# Patient Record
Sex: Female | Born: 1940 | Race: Black or African American | Hispanic: No | Smoking: Never smoker
Health system: Southern US, Community
[De-identification: ages and names within clinical notes are randomized; demographics above are authoritative.]

## PROBLEM LIST (undated history)

## (undated) DIAGNOSIS — H409 Unspecified glaucoma: Secondary | ICD-10-CM

## (undated) HISTORY — PX: CHOLECYSTECTOMY: SHX55

## (undated) HISTORY — PX: ABDOMINAL HYSTERECTOMY: SHX81

---

## 2020-06-10 DIAGNOSIS — I10 Essential (primary) hypertension: Secondary | ICD-10-CM | POA: Diagnosis not present

## 2020-06-10 DIAGNOSIS — I82503 Chronic embolism and thrombosis of unspecified deep veins of lower extremity, bilateral: Secondary | ICD-10-CM | POA: Diagnosis not present

## 2020-06-10 DIAGNOSIS — I2601 Septic pulmonary embolism with acute cor pulmonale: Secondary | ICD-10-CM | POA: Diagnosis not present

## 2020-06-10 DIAGNOSIS — Z23 Encounter for immunization: Secondary | ICD-10-CM | POA: Diagnosis not present

## 2020-07-02 DIAGNOSIS — I10 Essential (primary) hypertension: Secondary | ICD-10-CM | POA: Diagnosis not present

## 2020-07-02 DIAGNOSIS — I2601 Septic pulmonary embolism with acute cor pulmonale: Secondary | ICD-10-CM | POA: Diagnosis not present

## 2020-07-02 DIAGNOSIS — E782 Mixed hyperlipidemia: Secondary | ICD-10-CM | POA: Diagnosis not present

## 2020-07-02 DIAGNOSIS — I82503 Chronic embolism and thrombosis of unspecified deep veins of lower extremity, bilateral: Secondary | ICD-10-CM | POA: Diagnosis not present

## 2020-07-08 DIAGNOSIS — H25813 Combined forms of age-related cataract, bilateral: Secondary | ICD-10-CM | POA: Diagnosis not present

## 2020-07-08 DIAGNOSIS — H04123 Dry eye syndrome of bilateral lacrimal glands: Secondary | ICD-10-CM | POA: Diagnosis not present

## 2020-07-08 DIAGNOSIS — H401131 Primary open-angle glaucoma, bilateral, mild stage: Secondary | ICD-10-CM | POA: Diagnosis not present

## 2020-07-30 DIAGNOSIS — M545 Low back pain, unspecified: Secondary | ICD-10-CM | POA: Diagnosis not present

## 2020-07-30 DIAGNOSIS — I10 Essential (primary) hypertension: Secondary | ICD-10-CM | POA: Diagnosis not present

## 2020-07-30 DIAGNOSIS — I82503 Chronic embolism and thrombosis of unspecified deep veins of lower extremity, bilateral: Secondary | ICD-10-CM | POA: Diagnosis not present

## 2020-07-30 DIAGNOSIS — E782 Mixed hyperlipidemia: Secondary | ICD-10-CM | POA: Diagnosis not present

## 2020-07-30 DIAGNOSIS — M130 Polyarthritis, unspecified: Secondary | ICD-10-CM | POA: Diagnosis not present

## 2020-08-04 DIAGNOSIS — M545 Low back pain, unspecified: Secondary | ICD-10-CM | POA: Diagnosis not present

## 2020-08-18 DIAGNOSIS — M545 Low back pain, unspecified: Secondary | ICD-10-CM | POA: Diagnosis not present

## 2020-08-28 DIAGNOSIS — M5126 Other intervertebral disc displacement, lumbar region: Secondary | ICD-10-CM | POA: Diagnosis not present

## 2020-10-07 DIAGNOSIS — H04123 Dry eye syndrome of bilateral lacrimal glands: Secondary | ICD-10-CM | POA: Diagnosis not present

## 2020-10-07 DIAGNOSIS — H25813 Combined forms of age-related cataract, bilateral: Secondary | ICD-10-CM | POA: Diagnosis not present

## 2020-10-07 DIAGNOSIS — H401131 Primary open-angle glaucoma, bilateral, mild stage: Secondary | ICD-10-CM | POA: Diagnosis not present

## 2020-10-30 DIAGNOSIS — E039 Hypothyroidism, unspecified: Secondary | ICD-10-CM | POA: Diagnosis not present

## 2020-10-30 DIAGNOSIS — E782 Mixed hyperlipidemia: Secondary | ICD-10-CM | POA: Diagnosis not present

## 2020-10-30 DIAGNOSIS — I82503 Chronic embolism and thrombosis of unspecified deep veins of lower extremity, bilateral: Secondary | ICD-10-CM | POA: Diagnosis not present

## 2020-10-30 DIAGNOSIS — I10 Essential (primary) hypertension: Secondary | ICD-10-CM | POA: Diagnosis not present

## 2020-10-30 DIAGNOSIS — Z0001 Encounter for general adult medical examination with abnormal findings: Secondary | ICD-10-CM | POA: Diagnosis not present

## 2020-10-30 DIAGNOSIS — E1169 Type 2 diabetes mellitus with other specified complication: Secondary | ICD-10-CM | POA: Diagnosis not present

## 2020-11-04 ENCOUNTER — Emergency Department (HOSPITAL_COMMUNITY): Payer: Medicare Other

## 2020-11-04 ENCOUNTER — Inpatient Hospital Stay (HOSPITAL_COMMUNITY): Payer: Medicare Other

## 2020-11-04 ENCOUNTER — Inpatient Hospital Stay (HOSPITAL_COMMUNITY)
Admission: EM | Admit: 2020-11-04 | Discharge: 2020-11-11 | DRG: 871 | Disposition: A | Discharge status: Home Health | Payer: Medicare Other | Attending: Internal Medicine | Admitting: Internal Medicine

## 2020-11-04 ENCOUNTER — Encounter (HOSPITAL_COMMUNITY): Payer: Self-pay | Admitting: Family Medicine

## 2020-11-04 ENCOUNTER — Other Ambulatory Visit: Payer: Self-pay

## 2020-11-04 DIAGNOSIS — D1809 Hemangioma of other sites: Secondary | ICD-10-CM | POA: Diagnosis present

## 2020-11-04 DIAGNOSIS — R17 Unspecified jaundice: Secondary | ICD-10-CM | POA: Diagnosis not present

## 2020-11-04 DIAGNOSIS — H409 Unspecified glaucoma: Secondary | ICD-10-CM | POA: Diagnosis present

## 2020-11-04 DIAGNOSIS — R6889 Other general symptoms and signs: Secondary | ICD-10-CM | POA: Diagnosis not present

## 2020-11-04 DIAGNOSIS — Z7901 Long term (current) use of anticoagulants: Secondary | ICD-10-CM | POA: Diagnosis not present

## 2020-11-04 DIAGNOSIS — R001 Bradycardia, unspecified: Secondary | ICD-10-CM | POA: Diagnosis not present

## 2020-11-04 DIAGNOSIS — K5730 Diverticulosis of large intestine without perforation or abscess without bleeding: Secondary | ICD-10-CM | POA: Diagnosis not present

## 2020-11-04 DIAGNOSIS — Z743 Need for continuous supervision: Secondary | ICD-10-CM | POA: Diagnosis not present

## 2020-11-04 DIAGNOSIS — K8030 Calculus of bile duct with cholangitis, unspecified, without obstruction: Secondary | ICD-10-CM | POA: Diagnosis present

## 2020-11-04 DIAGNOSIS — R112 Nausea with vomiting, unspecified: Secondary | ICD-10-CM | POA: Diagnosis not present

## 2020-11-04 DIAGNOSIS — A419 Sepsis, unspecified organism: Principal | ICD-10-CM

## 2020-11-04 DIAGNOSIS — Z20822 Contact with and (suspected) exposure to covid-19: Secondary | ICD-10-CM | POA: Diagnosis present

## 2020-11-04 DIAGNOSIS — K8309 Other cholangitis: Secondary | ICD-10-CM | POA: Diagnosis not present

## 2020-11-04 DIAGNOSIS — D696 Thrombocytopenia, unspecified: Secondary | ICD-10-CM | POA: Diagnosis not present

## 2020-11-04 DIAGNOSIS — Z86711 Personal history of pulmonary embolism: Secondary | ICD-10-CM | POA: Diagnosis not present

## 2020-11-04 DIAGNOSIS — Z79899 Other long term (current) drug therapy: Secondary | ICD-10-CM

## 2020-11-04 DIAGNOSIS — R7989 Other specified abnormal findings of blood chemistry: Secondary | ICD-10-CM

## 2020-11-04 DIAGNOSIS — Z48815 Encounter for surgical aftercare following surgery on the digestive system: Secondary | ICD-10-CM | POA: Diagnosis not present

## 2020-11-04 DIAGNOSIS — R0902 Hypoxemia: Secondary | ICD-10-CM | POA: Diagnosis not present

## 2020-11-04 DIAGNOSIS — R933 Abnormal findings on diagnostic imaging of other parts of digestive tract: Secondary | ICD-10-CM

## 2020-11-04 DIAGNOSIS — R6521 Severe sepsis with septic shock: Secondary | ICD-10-CM | POA: Diagnosis present

## 2020-11-04 DIAGNOSIS — I10 Essential (primary) hypertension: Secondary | ICD-10-CM

## 2020-11-04 DIAGNOSIS — R748 Abnormal levels of other serum enzymes: Secondary | ICD-10-CM | POA: Diagnosis not present

## 2020-11-04 DIAGNOSIS — R109 Unspecified abdominal pain: Secondary | ICD-10-CM | POA: Diagnosis not present

## 2020-11-04 DIAGNOSIS — K8050 Calculus of bile duct without cholangitis or cholecystitis without obstruction: Secondary | ICD-10-CM | POA: Diagnosis not present

## 2020-11-04 DIAGNOSIS — R7401 Elevation of levels of liver transaminase levels: Secondary | ICD-10-CM | POA: Diagnosis not present

## 2020-11-04 DIAGNOSIS — R52 Pain, unspecified: Secondary | ICD-10-CM

## 2020-11-04 DIAGNOSIS — E876 Hypokalemia: Secondary | ICD-10-CM | POA: Diagnosis not present

## 2020-11-04 DIAGNOSIS — K5790 Diverticulosis of intestine, part unspecified, without perforation or abscess without bleeding: Secondary | ICD-10-CM | POA: Diagnosis not present

## 2020-11-04 DIAGNOSIS — D6959 Other secondary thrombocytopenia: Secondary | ICD-10-CM | POA: Diagnosis not present

## 2020-11-04 DIAGNOSIS — R932 Abnormal findings on diagnostic imaging of liver and biliary tract: Secondary | ICD-10-CM | POA: Diagnosis not present

## 2020-11-04 HISTORY — DX: Unspecified glaucoma: H40.9

## 2020-11-04 LAB — POC OCCULT BLOOD, ED: Fecal Occult Bld: NEGATIVE

## 2020-11-04 LAB — CBC WITH DIFFERENTIAL/PLATELET
Abs Immature Granulocytes: 0.01 10*3/uL (ref 0.00–0.07)
Basophils Absolute: 0 10*3/uL (ref 0.0–0.1)
Basophils Relative: 0 %
Eosinophils Absolute: 0 10*3/uL (ref 0.0–0.5)
Eosinophils Relative: 0 %
HCT: 40.4 % (ref 36.0–46.0)
Hemoglobin: 13.8 g/dL (ref 12.0–15.0)
Immature Granulocytes: 0 %
Lymphocytes Relative: 3 %
Lymphs Abs: 0.2 10*3/uL — ABNORMAL LOW (ref 0.7–4.0)
MCH: 31.7 pg (ref 26.0–34.0)
MCHC: 34.2 g/dL (ref 30.0–36.0)
MCV: 92.7 fL (ref 80.0–100.0)
Monocytes Absolute: 0.1 10*3/uL (ref 0.1–1.0)
Monocytes Relative: 1 %
Neutro Abs: 4.9 10*3/uL (ref 1.7–7.7)
Neutrophils Relative %: 96 %
Platelets: 126 10*3/uL — ABNORMAL LOW (ref 150–400)
RBC: 4.36 MIL/uL (ref 3.87–5.11)
RDW: 14.1 % (ref 11.5–15.5)
WBC: 5.1 10*3/uL (ref 4.0–10.5)
nRBC: 0 % (ref 0.0–0.2)

## 2020-11-04 LAB — APTT: aPTT: 34 seconds (ref 24–36)

## 2020-11-04 LAB — COMPREHENSIVE METABOLIC PANEL
ALT: 224 U/L — ABNORMAL HIGH (ref 0–44)
AST: 300 U/L — ABNORMAL HIGH (ref 15–41)
Albumin: 3.2 g/dL — ABNORMAL LOW (ref 3.5–5.0)
Alkaline Phosphatase: 128 U/L — ABNORMAL HIGH (ref 38–126)
Anion gap: 13 (ref 5–15)
BUN: 20 mg/dL (ref 8–23)
CO2: 18 mmol/L — ABNORMAL LOW (ref 22–32)
Calcium: 9.2 mg/dL (ref 8.9–10.3)
Chloride: 108 mmol/L (ref 98–111)
Creatinine, Ser: 0.83 mg/dL (ref 0.44–1.00)
GFR, Estimated: >60 mL/min (ref >60)
Glucose, Bld: 167 mg/dL — ABNORMAL HIGH (ref 70–99)
Potassium: 3.5 mmol/L (ref 3.5–5.1)
Sodium: 139 mmol/L (ref 135–145)
Total Bilirubin: 7 mg/dL — ABNORMAL HIGH (ref 0.3–1.2)
Total Protein: 6.7 g/dL (ref 6.5–8.1)

## 2020-11-04 LAB — PROTIME-INR
INR: 1.8 — ABNORMAL HIGH (ref 0.8–1.2)
Prothrombin Time: 20.6 seconds — ABNORMAL HIGH (ref 11.4–15.2)

## 2020-11-04 LAB — PROCALCITONIN: Procalcitonin: 4.81 ng/mL

## 2020-11-04 LAB — RESP PANEL BY RT-PCR (FLU A&B, COVID) ARPGX2
Influenza A by PCR: NEGATIVE
Influenza B by PCR: NEGATIVE
SARS Coronavirus 2 by RT PCR: NEGATIVE

## 2020-11-04 LAB — HEPARIN LEVEL (UNFRACTIONATED): Heparin Unfractionated: 0.69 IU/mL (ref 0.30–0.70)

## 2020-11-04 LAB — LIPASE, BLOOD: Lipase: 18 U/L (ref 11–51)

## 2020-11-04 LAB — LACTIC ACID, PLASMA: Lactic Acid, Venous: 1.6 mmol/L (ref 0.5–1.9)

## 2020-11-04 IMAGING — US US ABDOMEN LIMITED
1 series · 15 of 25 positions shown · non-contrast
Comparison: [DATE] CT abdomen/pelvis

CLINICAL DATA: Jaundice, history of cholecystectomy

EXAM:
ULTRASOUND ABDOMEN LIMITED RIGHT UPPER QUADRANT

[Series 1: us abdomen limited · 15 of 36 slices shown]
[im 1/36]
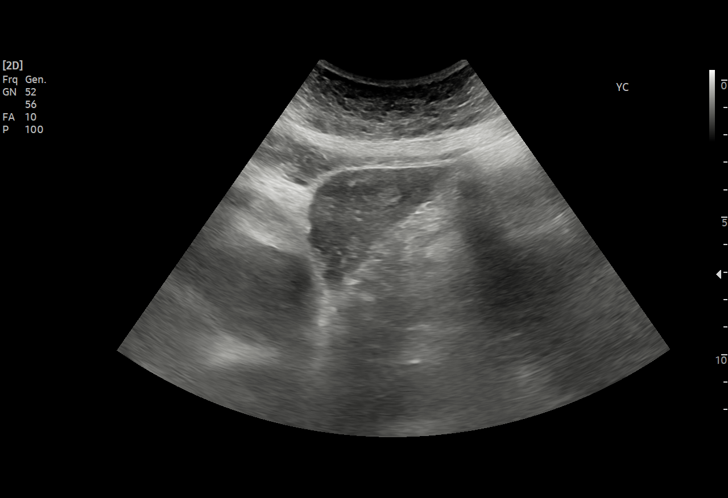
[im 3/36]
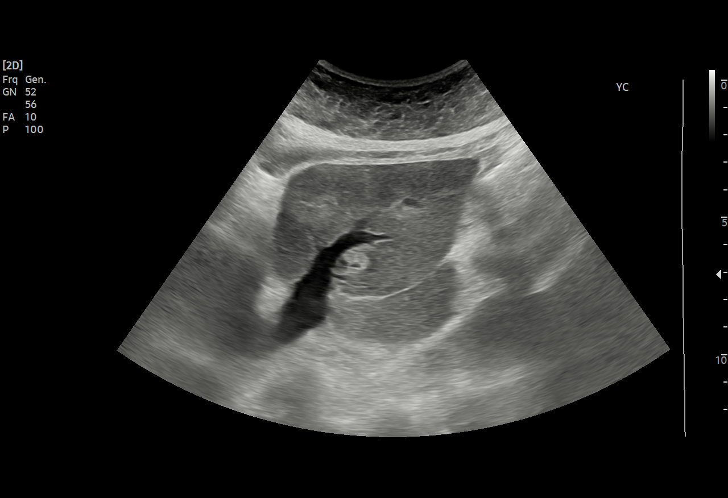
[im 6/36]
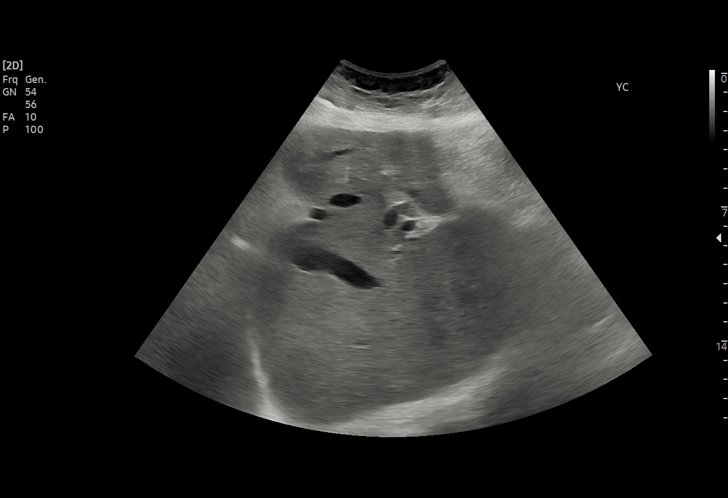
[im 8/36]
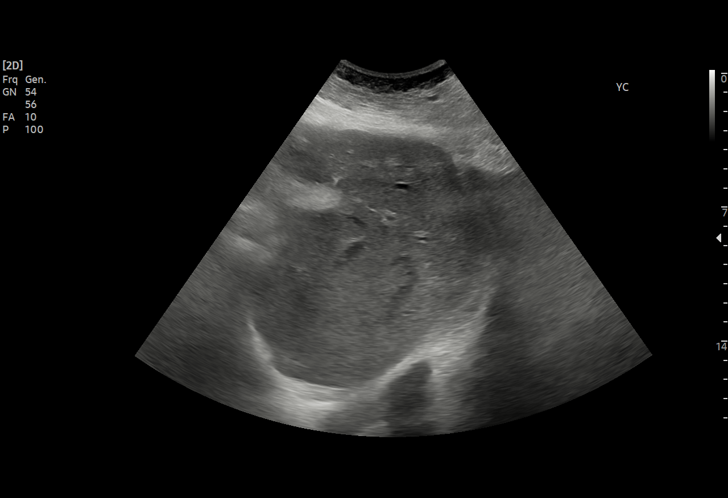
[im 11/36]
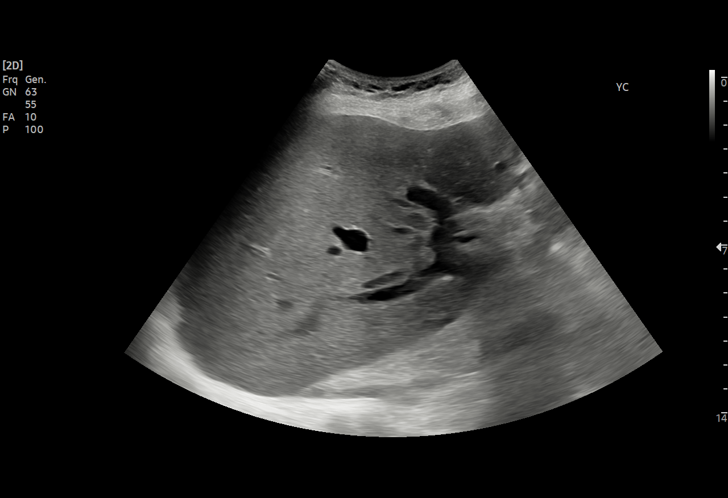
[im 14/36]
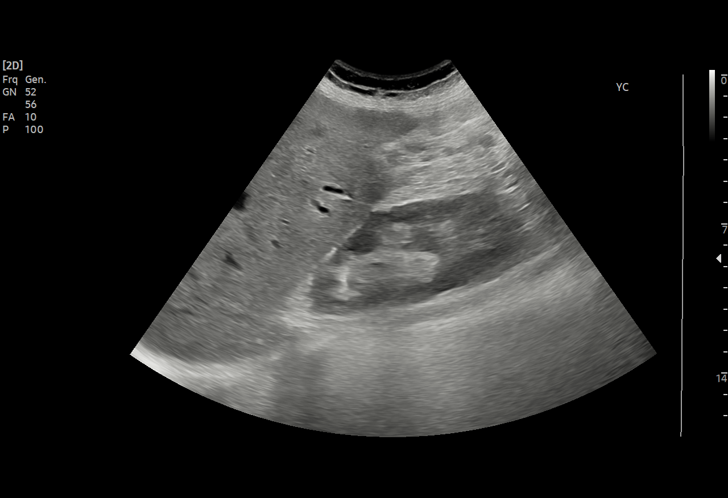
[im 15/36]
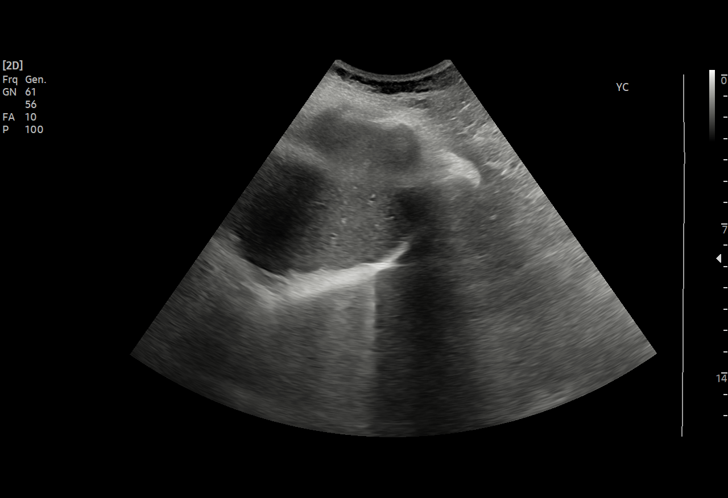
[im 18/36]
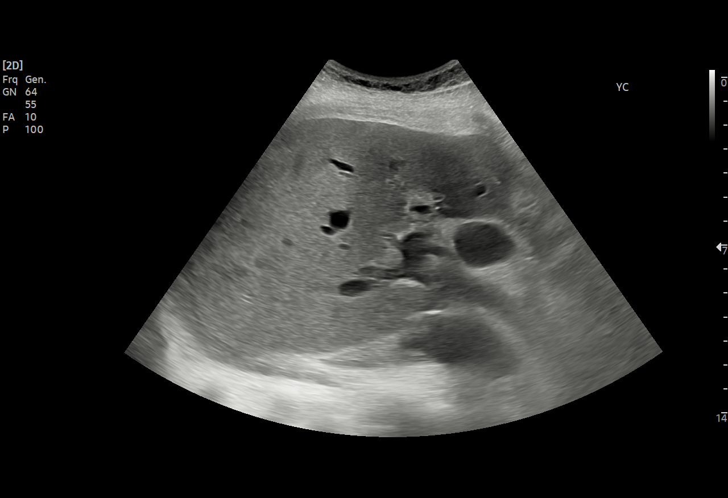
[im 21/36]
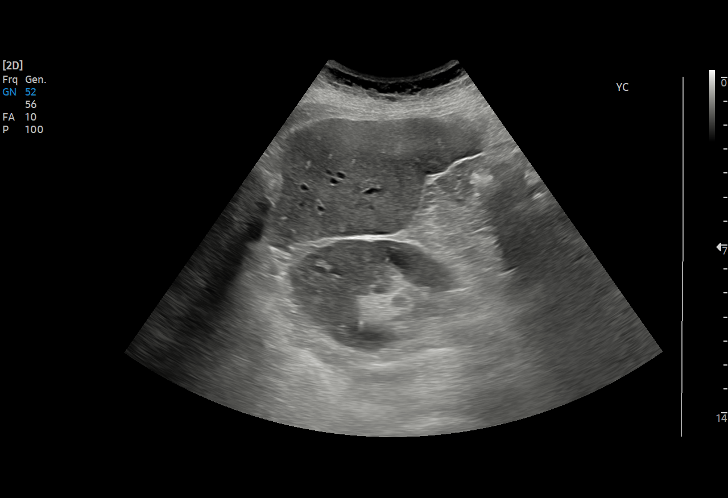
[im 22/36]
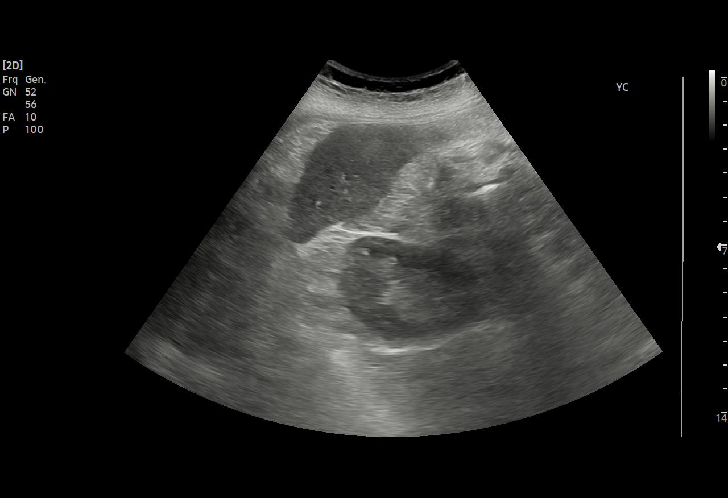
[im 25/36]
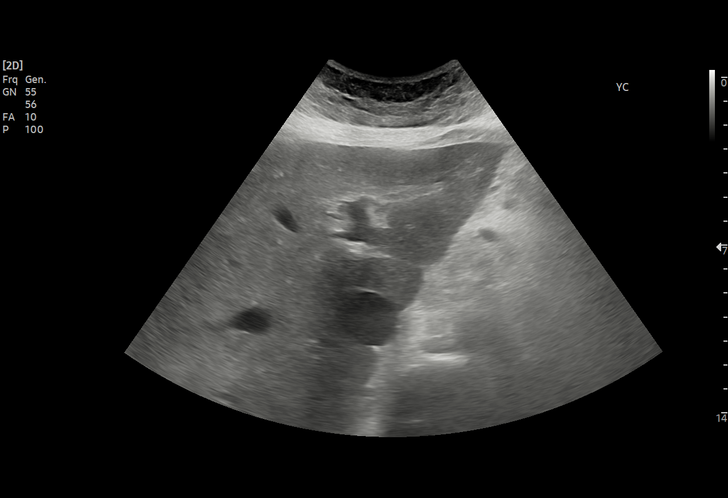
[im 28/36]
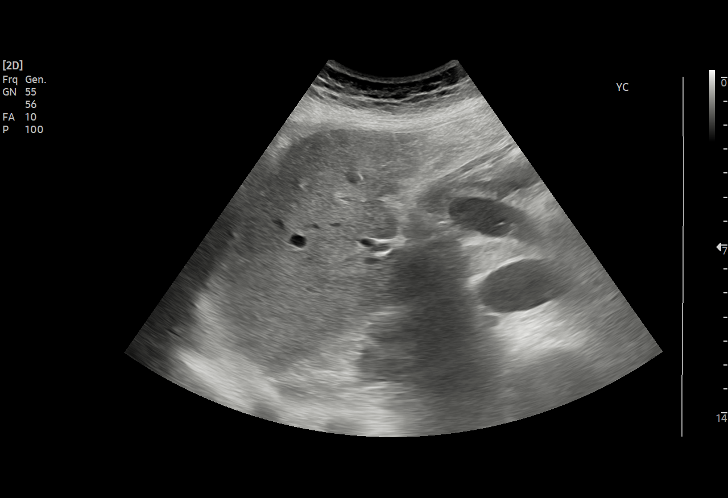
[im 30/36]
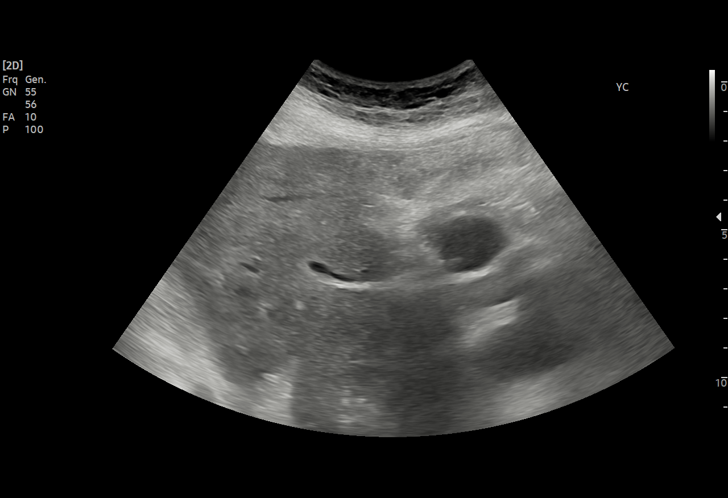
[im 33/36]
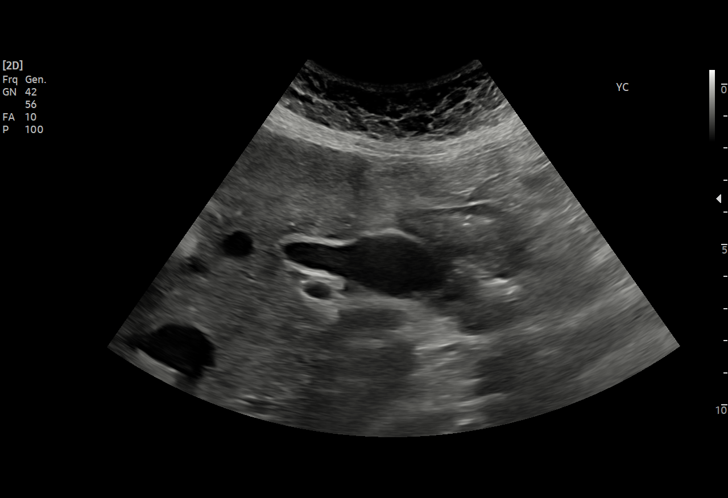
[im 36/36]
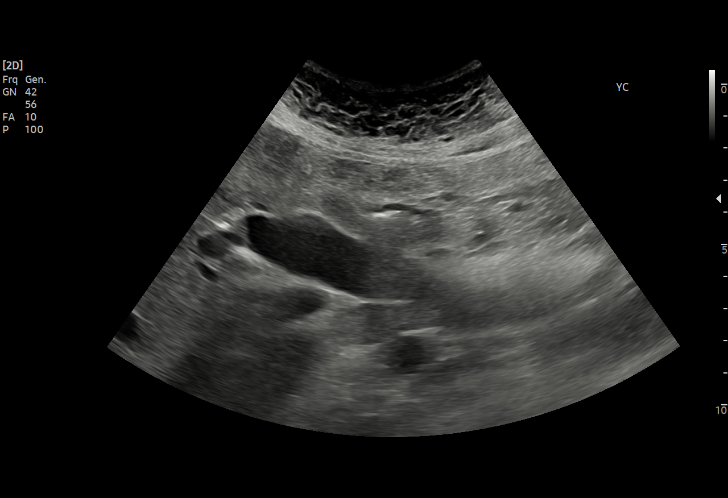

[15 of 25 positions shown; findings below may reference images not displayed]

FINDINGS: Gallbladder:

Surgically absent.

Common bile duct:

Diameter: 20 mm. No stones in the visualized bile ducts, noting
nonvisualization of the distal CBD due to overlying bowel gas.

Liver:

No focal lesion identified. Within normal limits in parenchymal
echogenicity. Portal vein is patent on color Doppler imaging with
normal direction of blood flow towards the liver.

Other: None.
IMPRESSION: Prominent extrahepatic biliary ductal dilatation with CBD diameter
20 mm. Suggest MRI abdomen with MRCP without and with IV contrast
for further evaluation to exclude obstructing choledocholithiasis.

## 2020-11-04 IMAGING — CT CT ABD-PELV W/ CM
2 of 5 series · 14 of 46 positions shown, 16 images · IV contrast (omnipaque)
Comparison: None.
COMPARISON: None.

Addendum:
CLINICAL DATA: Intermittent diffuse abdominal pain for the past 5
days. Associated chills. Nausea and vomiting for the past 2 days.

EXAM:
CT ABDOMEN AND PELVIS WITH CONTRAST
TECHNIQUE: Multidetector CT imaging of the abdomen and pelvis was performed
using the standard protocol following bolus administration of
intravenous contrast.
CONTRAST:  100mL OMNIPAQUE IOHEXOL 300 MG/ML  SOLN

[Series 2: axial st · axial · 0.79mm/px · z∈[+1035,+1395]mm · 11 of 85 slices shown, 13 images]
[im 7/85  soft-tissue]
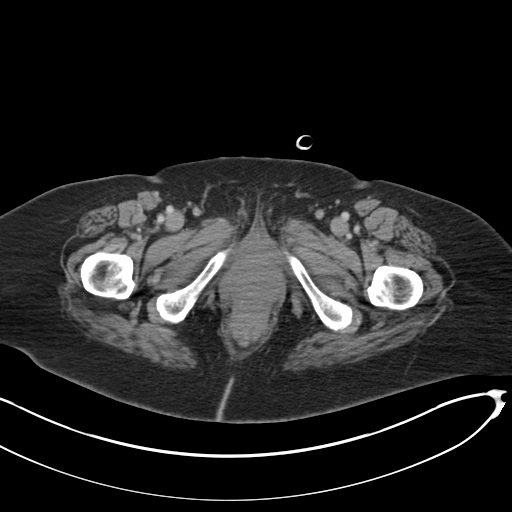
[im 7/85  bone]
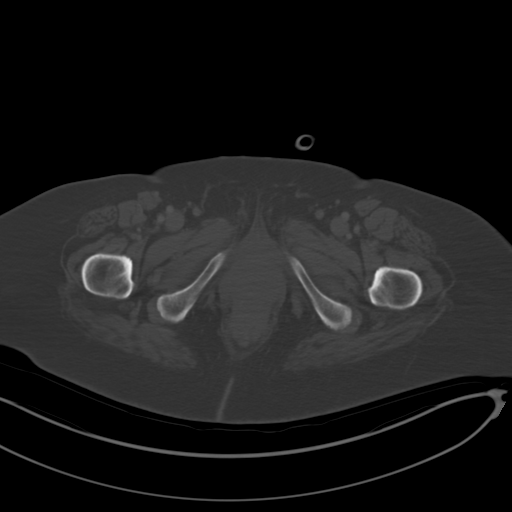
[im 13/85  soft-tissue]
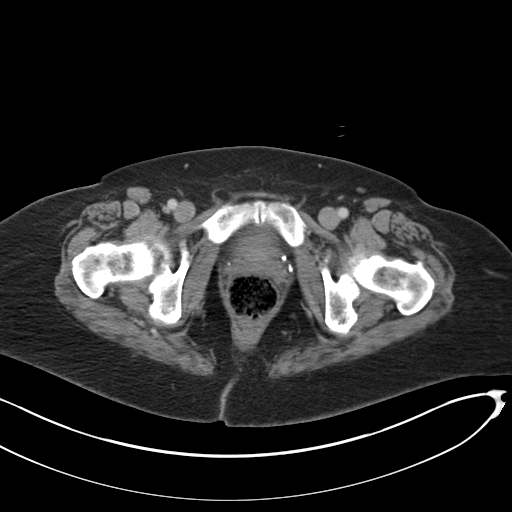
[im 19/85  soft-tissue]
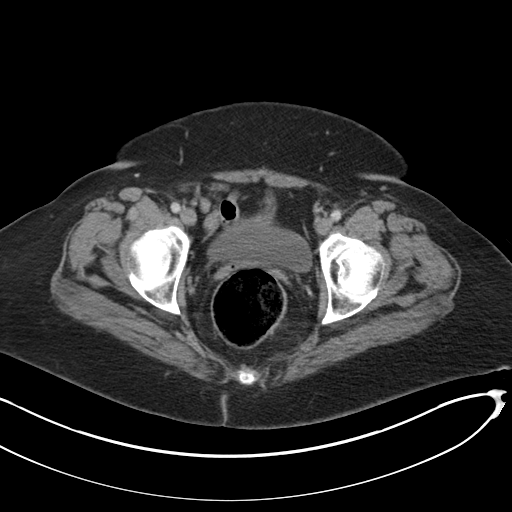
[im 31/85  soft-tissue]
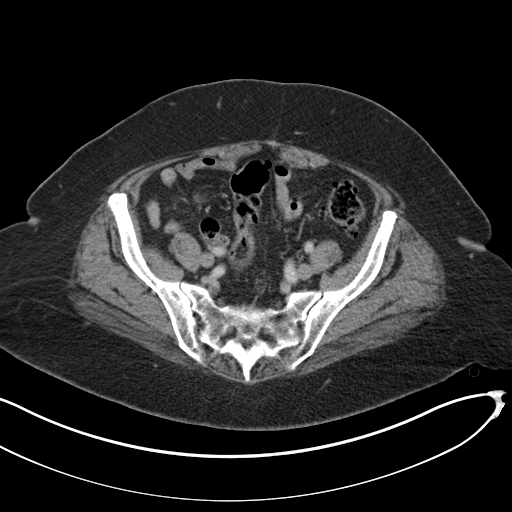
[im 37/85  soft-tissue]
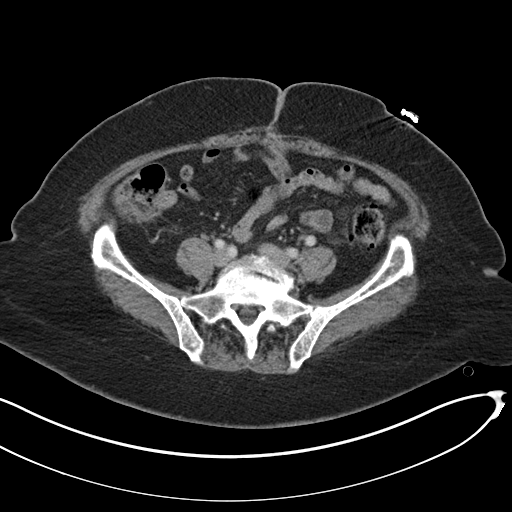
[im 43/85  soft-tissue]
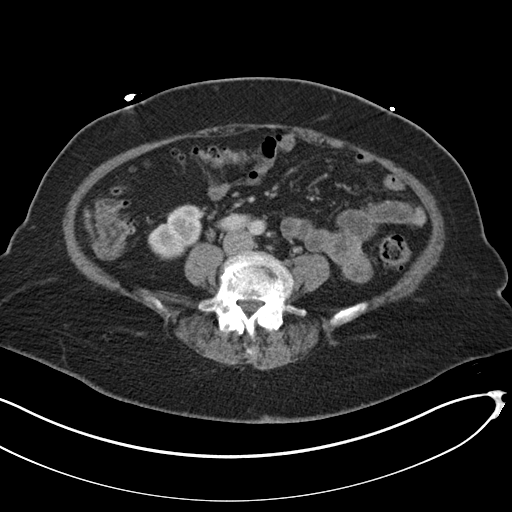
[im 49/85  soft-tissue]
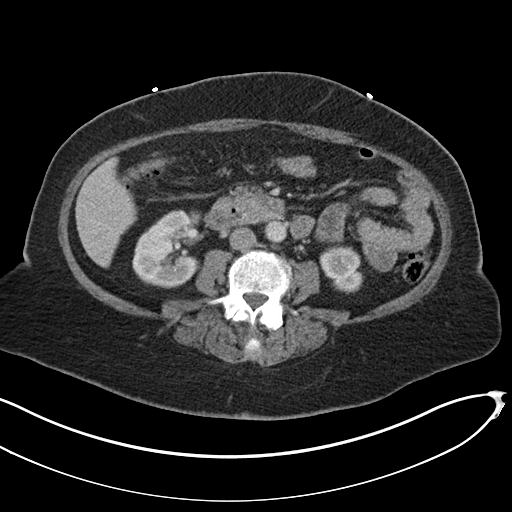
[im 55/85  soft-tissue]
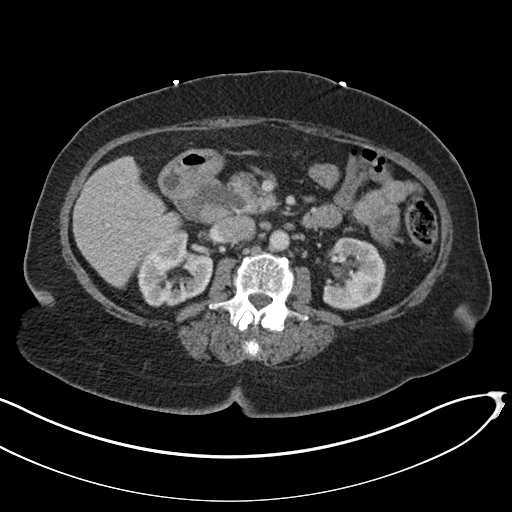
[im 67/85  soft-tissue]
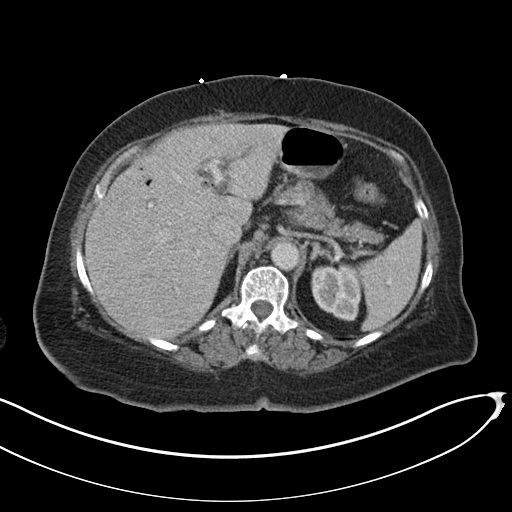
[im 67/85  bone]
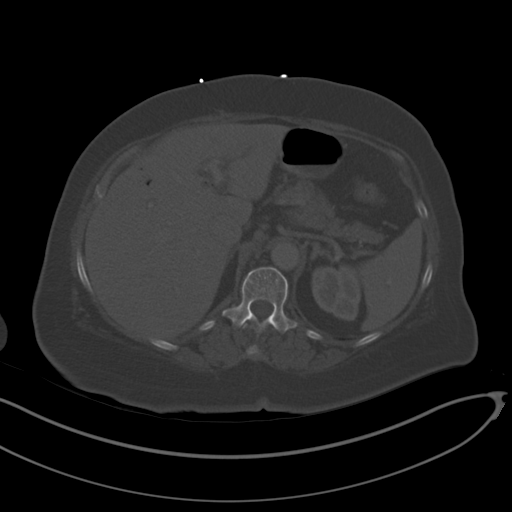
[im 73/85  soft-tissue]
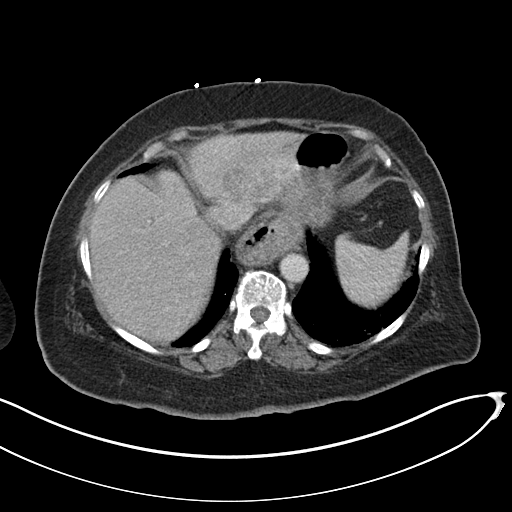
[im 79/85  soft-tissue]
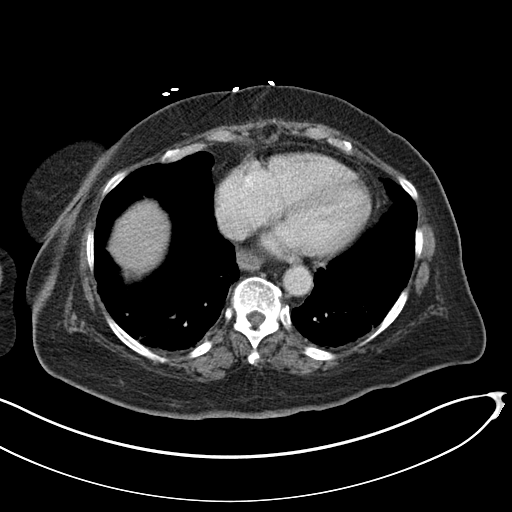

[Series 5: coronal st · coronal · 0.78mm/px · 3 of 150 slices shown]
[im 50/150  soft-tissue]
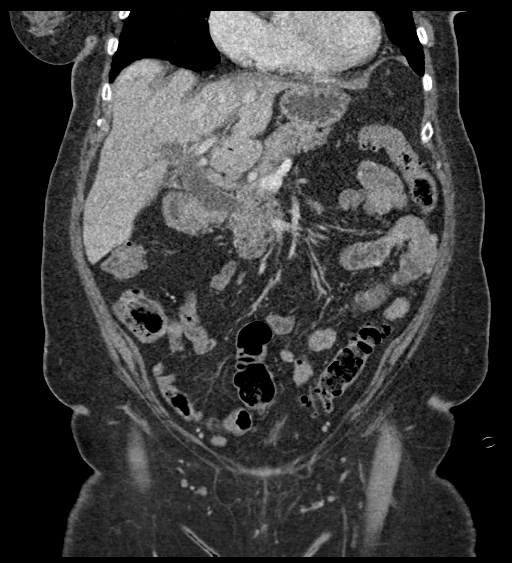
[im 67/150  soft-tissue]
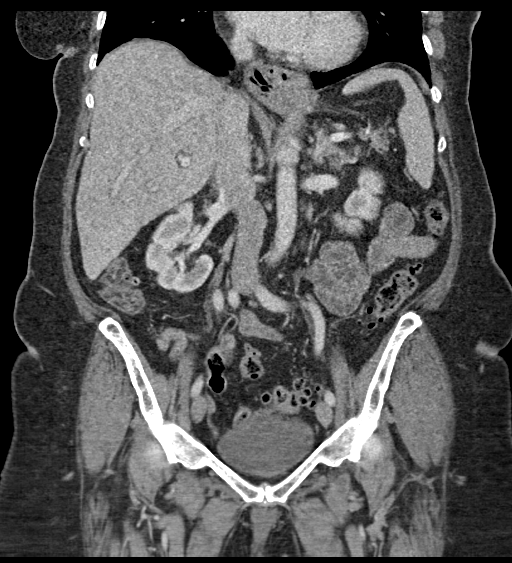
[im 83/150  soft-tissue]
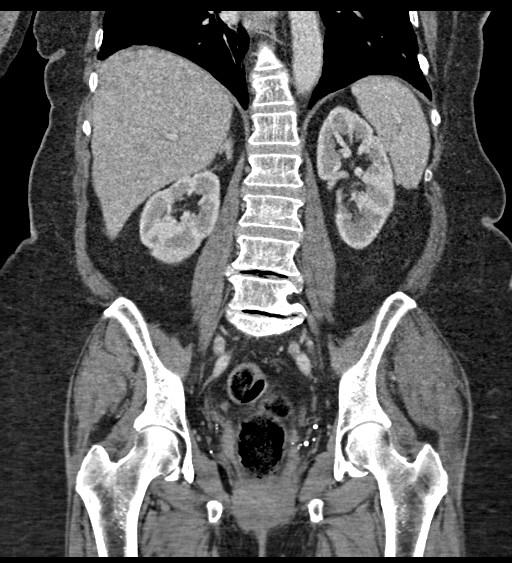

[14 of 46 positions shown; findings below may reference images not displayed]

FINDINGS: Lower chest: Moderate-sized hiatal hernia. Normal sized heart. Mild
bibasilar linear atelectasis/scarring.

Hepatobiliary: Air in the biliary tree. Surgically absent
gallbladder. Dilated common duct measuring 2.1 cm in maximum
diameter. No visible gallstones or mass.

Pancreas: Unremarkable. No pancreatic ductal dilatation or
surrounding inflammatory changes.

Spleen: Normal in size without focal abnormality.

Adrenals/Urinary Tract: Adrenal glands are unremarkable. Kidneys are
normal, without renal calculi, focal lesion, or hydronephrosis.
Bladder is unremarkable.

Stomach/Bowel: Moderate-sized hiatal hernia. Extensive colonic
diverticulosis without evidence of diverticulitis. Unremarkable
small bowel and appendix.

Vascular/Lymphatic: No significant vascular findings are present. No
enlarged abdominal or pelvic lymph nodes.

Reproductive: Status post hysterectomy. No adnexal masses.

Other: No abdominal wall hernia or abnormality. No abdominopelvic
ascites.

Musculoskeletal: Lower lumbar spine degenerative changes.
IMPRESSION: 1. No acute abnormality.
2. Moderate-sized hiatal hernia.
3. Extensive colonic diverticulosis.

ADDENDUM:
There is also mild to moderate intrahepatic biliary ductal
dilatation the lateral segment of the left lobe of the liver with
ill-defined decreased density in that portion of the liver as well
as a better defined heterogeneous area of low density measuring
x 3.6 cm on image number [DATE] and 2.3 cm in length on coronal image
number 45/5. There is also a suggestion of a noncalcified distal
common duct stone containing nitrogen in the distal common duct on
image number 35/2, measuring 8 mm in diameter. Therefore, there
should be additional impressions as follows:

1. Biliary ductal dilatation out of proportion for what is expected
for an 80-year-old patient with a previous cholecystectomy with a
possible noncalcified distal common duct obstructing stone. Further
evaluation with MRCP is recommended.
2. Ill-defined low density in the lateral segment of the left lobe
of the liver where there is more intrahepatic biliary ductal
dilatation as well as a 5.7 x 3.6 x 2.3 cm more discrete area of
heterogeneous low density in the lateral segment of the left lobe of
the liver. This could represent hepatic infection or malignancy.
This could also be further evaluated at the time of MRCP.
3. The biliary air could be due to biliary infection, recent stone
passage or previous sphincterotomy.

Note: The changes in the report have been discussed with Dr. JAKARI.

*** End of Addendum ***
FINDINGS: Lower chest: Moderate-sized hiatal hernia. Normal sized heart. Mild
bibasilar linear atelectasis/scarring.

Hepatobiliary: Air in the biliary tree. Surgically absent
gallbladder. Dilated common duct measuring 2.1 cm in maximum
diameter. No visible gallstones or mass.

Pancreas: Unremarkable. No pancreatic ductal dilatation or
surrounding inflammatory changes.

Spleen: Normal in size without focal abnormality.

Adrenals/Urinary Tract: Adrenal glands are unremarkable. Kidneys are
normal, without renal calculi, focal lesion, or hydronephrosis.
Bladder is unremarkable.

Stomach/Bowel: Moderate-sized hiatal hernia. Extensive colonic
diverticulosis without evidence of diverticulitis. Unremarkable
small bowel and appendix.

Vascular/Lymphatic: No significant vascular findings are present. No
enlarged abdominal or pelvic lymph nodes.

Reproductive: Status post hysterectomy. No adnexal masses.

Other: No abdominal wall hernia or abnormality. No abdominopelvic
ascites.

Musculoskeletal: Lower lumbar spine degenerative changes.
IMPRESSION: 1. No acute abnormality.
2. Moderate-sized hiatal hernia.
3. Extensive colonic diverticulosis.

## 2020-11-04 IMAGING — DX DG CHEST 1V PORT
1 series · 1 of 1 positions shown · non-contrast
Comparison: None.

CLINICAL DATA: Questionable sepsis. Patient reports abdominal pain.

EXAM:
PORTABLE CHEST 1 VIEW

[chest ap]
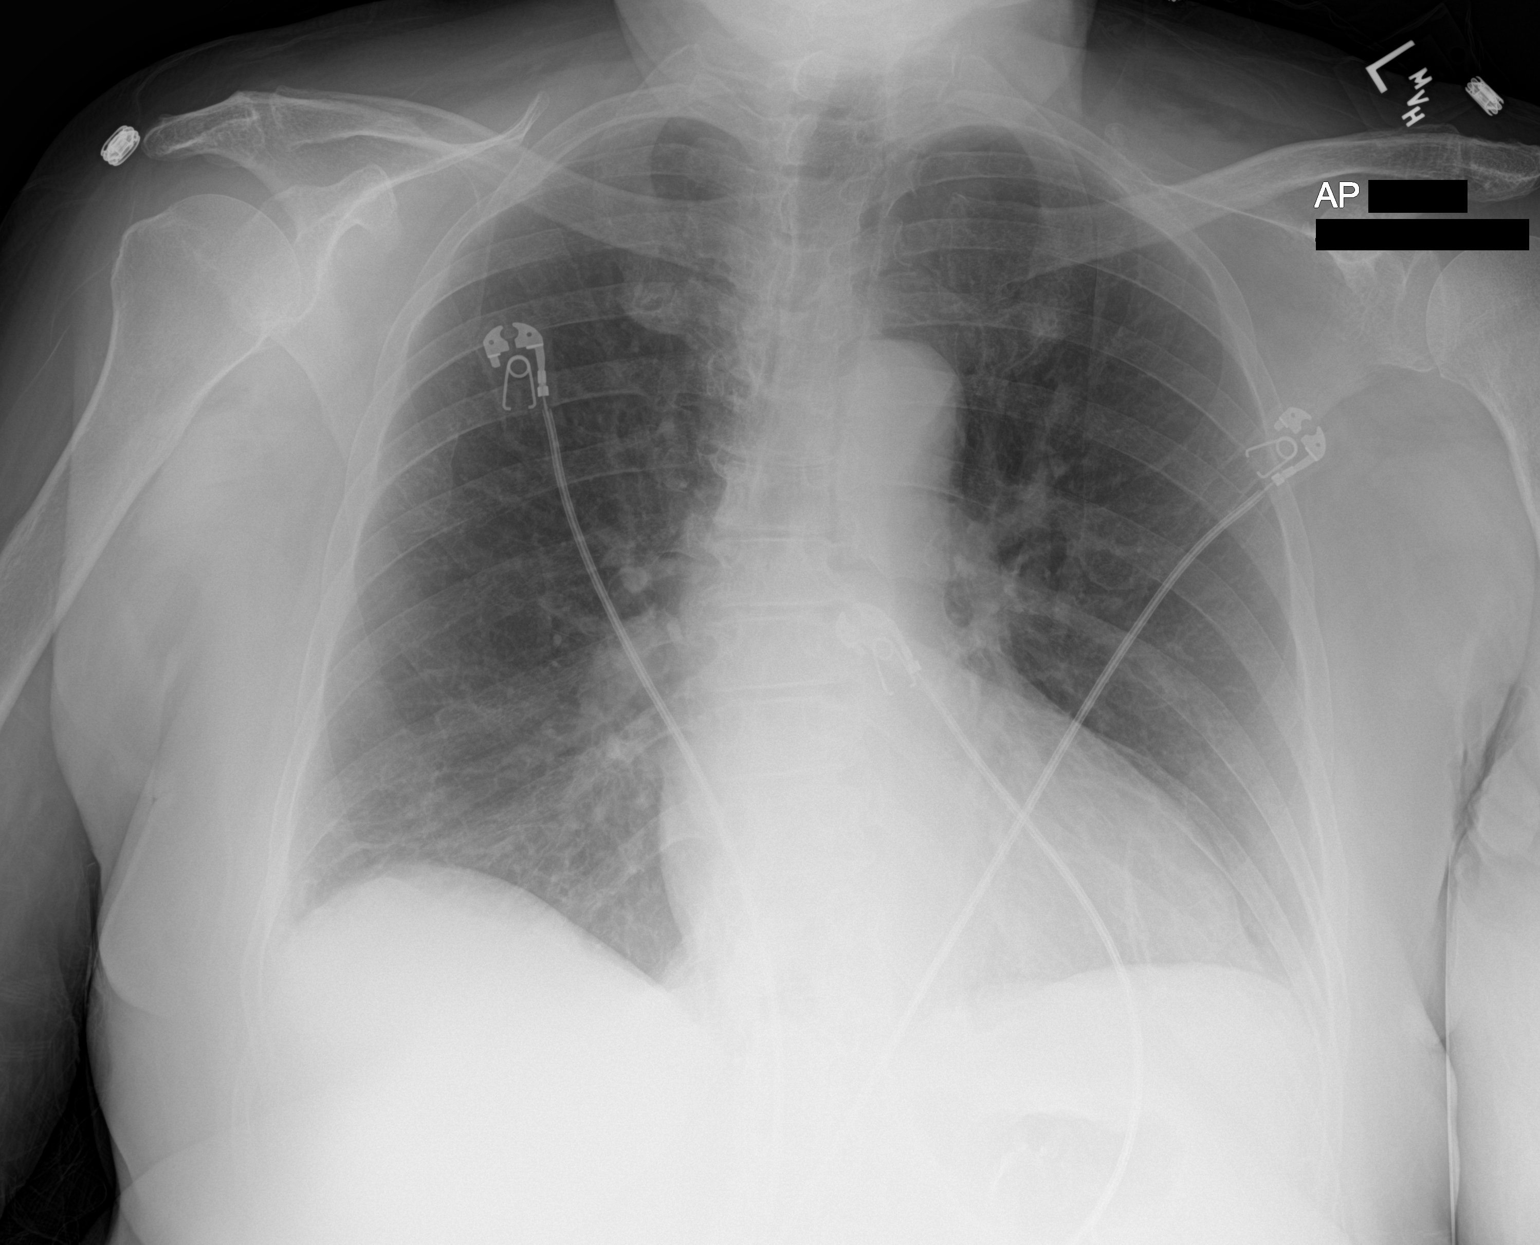

[1 of 1 positions shown; findings below may reference images not displayed]

FINDINGS: The cardiomediastinal contours are normal. Streaky atelectasis or
scarring at the lung bases. Pulmonary vasculature is normal. No
consolidation, pleural effusion, or pneumothorax. No acute osseous
abnormalities are seen.
IMPRESSION: Streaky atelectasis or scarring at the lung bases. No evidence of
pneumonia.

## 2020-11-04 MED ORDER — LACTATED RINGERS IV BOLUS (SEPSIS)
1000.0000 mL | Freq: Once | INTRAVENOUS | Status: AC
Start: 2020-11-04 — End: 2020-11-04
  Administered 2020-11-04: 1000 mL via INTRAVENOUS

## 2020-11-04 MED ORDER — TROLAMINE SALICYLATE 10 % EX CREA: 1.0000 "application " | TOPICAL_CREAM | CUTANEOUS | Status: DC | PRN

## 2020-11-04 MED ORDER — LATANOPROST 0.005 % OP SOLN
1.0000 [drp] | Freq: Every day | OPHTHALMIC | Status: DC
  Administered 2020-11-04 – 2020-11-10 (×7): 1 [drp] via OPHTHALMIC
  Filled 2020-11-04: qty 2.5

## 2020-11-04 MED ORDER — SODIUM CHLORIDE 0.9 % IV SOLN
2.0000 g | Freq: Once | INTRAVENOUS | Status: AC
  Administered 2020-11-04: 2 g via INTRAVENOUS
  Filled 2020-11-04: qty 2

## 2020-11-04 MED ORDER — IOHEXOL 300 MG/ML  SOLN
100.0000 mL | Freq: Once | INTRAMUSCULAR | Status: AC | PRN
  Administered 2020-11-04: 100 mL via INTRAVENOUS

## 2020-11-04 MED ORDER — HEPARIN (PORCINE) 25000 UT/250ML-% IV SOLN
900.0000 [IU]/h | INTRAVENOUS | Status: DC
  Administered 2020-11-04: 900 [IU]/h via INTRAVENOUS
  Filled 2020-11-04: qty 250

## 2020-11-04 MED ORDER — ONDANSETRON HCL 4 MG PO TABS
4.0000 mg | ORAL_TABLET | Freq: Four times a day (QID) | ORAL | Status: DC | PRN
Start: 2020-11-04 — End: 2020-11-11

## 2020-11-04 MED ORDER — LACTATED RINGERS IV BOLUS (SEPSIS)
250.0000 mL | Freq: Once | INTRAVENOUS | Status: AC
  Administered 2020-11-04: 250 mL via INTRAVENOUS

## 2020-11-04 MED ORDER — SODIUM CHLORIDE 0.9 % IV BOLUS
1000.0000 mL | Freq: Once | INTRAVENOUS | Status: AC
  Administered 2020-11-04: 1000 mL via INTRAVENOUS

## 2020-11-04 MED ORDER — METRONIDAZOLE 500 MG/100ML IV SOLN
500.0000 mg | Freq: Three times a day (TID) | INTRAVENOUS | Status: DC
  Administered 2020-11-05 – 2020-11-11 (×19): 500 mg via INTRAVENOUS
  Filled 2020-11-04 (×20): qty 100

## 2020-11-04 MED ORDER — METRONIDAZOLE 500 MG/100ML IV SOLN
500.0000 mg | Freq: Once | INTRAVENOUS | Status: AC
  Administered 2020-11-04: 500 mg via INTRAVENOUS
  Filled 2020-11-04: qty 100

## 2020-11-04 MED ORDER — SODIUM CHLORIDE 0.9 % IV SOLN: INTRAVENOUS | Status: AC

## 2020-11-04 MED ORDER — MUSCLE RUB 10-15 % EX CREA
TOPICAL_CREAM | CUTANEOUS | Status: DC | PRN
  Filled 2020-11-04: qty 85

## 2020-11-04 MED ORDER — SODIUM CHLORIDE 0.9 % IV SOLN
2.0000 g | Freq: Two times a day (BID) | INTRAVENOUS | Status: DC
  Administered 2020-11-05 – 2020-11-11 (×13): 2 g via INTRAVENOUS
  Filled 2020-11-04 (×13): qty 2

## 2020-11-04 MED ORDER — ACETAMINOPHEN 325 MG PO TABS
650.0000 mg | ORAL_TABLET | Freq: Once | ORAL | Status: AC
  Administered 2020-11-04: 650 mg via ORAL
  Filled 2020-11-04: qty 2

## 2020-11-04 MED ORDER — LACTATED RINGERS IV BOLUS (SEPSIS)
500.0000 mL | Freq: Once | INTRAVENOUS | Status: AC
  Administered 2020-11-04: 500 mL via INTRAVENOUS

## 2020-11-04 MED ORDER — ACETAMINOPHEN 325 MG PO TABS: 650.0000 mg | ORAL_TABLET | Freq: Four times a day (QID) | ORAL | Status: DC | PRN

## 2020-11-04 MED ORDER — ACETAMINOPHEN 650 MG RE SUPP: 650.0000 mg | Freq: Four times a day (QID) | RECTAL | Status: DC | PRN

## 2020-11-04 MED ORDER — LACTATED RINGERS IV SOLN: INTRAVENOUS | Status: DC

## 2020-11-04 MED ORDER — DORZOLAMIDE HCL-TIMOLOL MAL 2-0.5 % OP SOLN
1.0000 [drp] | Freq: Two times a day (BID) | OPHTHALMIC | Status: DC
  Administered 2020-11-05 – 2020-11-11 (×11): 1 [drp] via OPHTHALMIC
  Filled 2020-11-04 (×2): qty 10

## 2020-11-04 MED ORDER — ONDANSETRON HCL 4 MG/2ML IJ SOLN: 4.0000 mg | Freq: Four times a day (QID) | INTRAMUSCULAR | Status: DC | PRN

## 2020-11-04 MED ORDER — FENTANYL CITRATE (PF) 100 MCG/2ML IJ SOLN: 12.5000 ug | INTRAMUSCULAR | Status: DC | PRN

## 2020-11-04 NOTE — ED Notes (Signed)
Pt unable to give urine sample at this time. Respirations even and unlabored b/p is low 87/53 md aware. Denies any needs at this time

## 2020-11-04 NOTE — Progress Notes (Signed)
Pharmacy Antibiotic Note  Qamar Rosman Deon Pilling is a 80 y.o. female admitted on 11/04/2020 with sepsis (concern for cholangitis). Pharmacy has been consulted for Cefepime dosing.  Plan: Cefepime 2g IV q12h Metronidazole 500mg  IV q8h per MD Monitor renal function, cultures, clinical course  Height: 5\' 3"  (160 cm) Weight: 54.4 kg (120 lb) IBW/kg (Calculated) : 52.4  Temp (24hrs), Avg:99.1 F (37.3 C), Min:97.7 F (36.5 C), Max:104 F (40 C)  Recent Labs  Lab 11/04/20 1515  WBC 5.1  CREATININE 0.83  LATICACIDVEN 1.6    Estimated Creatinine Clearance: 44.7 mL/min (by C-G formula based on SCr of 0.83 mg/dL).    No Known Allergies  Antimicrobials this admission: 6/7 Cefepime >> 6/7 Metronidazole >>  Dose adjustments this admission: --  Microbiology results: 6/7 BCx: sent 6/7 UCx: ordered   Thank you for allowing pharmacy to be a part of this patient's care.  Luiz Ochoa 11/04/2020 8:41 PM

## 2020-11-04 NOTE — Progress Notes (Signed)
Elink following for code sepsis 

## 2020-11-04 NOTE — ED Provider Notes (Signed)
[facility] DEPT Provider Note   CSN: [id number] Arrival date & time: 11/04/20  1400     History No chief complaint on file.   Paula Bentley is a 79 y.o. female.  Patient complains of abdominal pain and vomiting for 5 days.  She has a history of hypertension.  She also is on Xarelto for unknown reason  The history is provided by the patient and medical records.  Abdominal Pain Pain location:  Generalized Pain quality: aching   Pain radiates to:  Does not radiate Pain severity:  Mild Onset quality:  Gradual Timing:  Intermittent Progression:  Worsening Chronicity:  New Context: not alcohol use   Relieved by:  Nothing Worsened by:  Nothing Associated symptoms: vomiting   Associated symptoms: no chest pain, no cough, no diarrhea, no fatigue and no hematuria        No past medical history on file.  Patient Active Problem List   Diagnosis Date Noted  . SIRS (systemic inflammatory response syndrome) ([place]) 11/04/2020       OB History   No obstetric history on file.     No family history on file.     Home Medications Prior to Admission medications   Medication Sig Start Date End Date Taking? Authorizing Provider  acetaminophen (TYLENOL) 325 MG tablet Take 650 mg by mouth every 6 (six) hours as needed for mild pain, fever or headache.   Yes [provider]  acetaminophen (TYLENOL) 650 MG CR tablet Take 650 mg by mouth every 8 (eight) hours as needed for pain.   Yes [provider]  dorzolamide-timolol (COSOPT) 22.3-6.8 MG/ML ophthalmic solution Place 1 drop into both eyes 2 (two) times daily. 09/12/20  Yes [provider]  latanoprost (XALATAN) 0.005 % ophthalmic solution Place 1 drop into both eyes at bedtime. 11/01/20  Yes [provider]  losartan (COZAAR) 25 MG tablet Take 25 mg by mouth daily. 09/29/20  Yes [provider]  trolamine salicylate (ASPERCREME) 10 % cream Apply 1  application topically as needed for muscle pain.   Yes [provider]  XARELTO 20 MG TABS tablet Take 20 mg by mouth daily. 10/19/20  Yes [provider]    Allergies    Patient has no known allergies.  Review of Systems   Review of Systems  Constitutional: Negative for appetite change and fatigue.  HENT: Negative for congestion, ear discharge and sinus pressure.   Eyes: Negative for discharge.  Respiratory: Negative for cough.   Cardiovascular: Negative for chest pain.  Gastrointestinal: Positive for abdominal pain and vomiting. Negative for diarrhea.  Genitourinary: Negative for frequency and hematuria.  Musculoskeletal: Negative for back pain.  Skin: Negative for rash.  Neurological: Negative for seizures and headaches.  Psychiatric/Behavioral: Negative for hallucinations.    Physical Exam Updated Vital Signs BP (!) 80/49 (BP Location: Left Arm)   Pulse 92   Temp 97.7 F (36.5 C) (Oral)   Resp (!) 25   Ht 5\' 3"  (1.6 m)   Wt 54.4 kg   SpO2 97%   BMI 21.26 kg/m   Physical Exam Vitals and nursing note reviewed.  Constitutional:      Appearance: She is well-developed.  HENT:     Head: Normocephalic.     Nose: Nose normal.  Eyes:     General: No scleral icterus.    Conjunctiva/sclera: Conjunctivae normal.  Neck:     Thyroid: No thyromegaly.  Cardiovascular:     Rate and Rhythm:  Normal rate and regular rhythm.     Heart sounds: No murmur heard. No friction rub. No gallop.   Pulmonary:     Breath sounds: No stridor. No wheezing or rales.  Chest:     Chest wall: No tenderness.  Abdominal:     General: There is no distension.     Tenderness: There is no abdominal tenderness. There is no rebound.  Genitourinary:    Comments: Brown stool heme-negative Musculoskeletal:        General: Normal range of motion.     Cervical back: Neck supple.  Lymphadenopathy:     Cervical: No cervical adenopathy.  Skin:    Findings: No erythema or rash.   Neurological:     Mental Status: She is alert and oriented to person, place, and time.     Motor: No abnormal muscle tone.     Coordination: Coordination normal.  Psychiatric:        Behavior: Behavior normal.     ED Results / Procedures / Treatments   Labs (all labs ordered are listed, but only abnormal results are displayed) Labs Reviewed  COMPREHENSIVE METABOLIC PANEL - Abnormal; Notable for the following components:      Result Value   CO2 18 (*)    Glucose, Bld 167 (*)    Albumin 3.2 (*)    AST 300 (*)    ALT 224 (*)    Alkaline Phosphatase 128 (*)    Total Bilirubin 7.0 (*)    All other components within normal limits  CBC WITH DIFFERENTIAL/PLATELET - Abnormal; Notable for the following components:   Platelets 126 (*)    Lymphs Abs 0.2 (*)    All other components within normal limits  PROTIME-INR - Abnormal; Notable for the following components:   Prothrombin Time 20.6 (*)    INR 1.8 (*)    All other components within normal limits  RESP PANEL BY RT-PCR (FLU A&B, COVID) ARPGX2  CULTURE, BLOOD (ROUTINE X 2)  CULTURE, BLOOD (ROUTINE X 2)  URINE CULTURE  LACTIC ACID, PLASMA  APTT  URINALYSIS, ROUTINE W REFLEX MICROSCOPIC  LACTIC ACID, PLASMA  OCCULT BLOOD X 1 CARD TO LAB, STOOL  LIPASE, BLOOD  PROCALCITONIN  POC OCCULT BLOOD, ED    EKG None  Radiology CT ABDOMEN PELVIS W CONTRAST  Result Date: 11/04/2020 CLINICAL DATA:  Intermittent diffuse abdominal pain for the past 5 days. Associated chills. Nausea and vomiting for the past 2 days. EXAM: CT ABDOMEN AND PELVIS WITH CONTRAST TECHNIQUE: Multidetector CT imaging of the abdomen and pelvis was performed using the standard protocol following bolus administration of intravenous contrast. CONTRAST:  159mL OMNIPAQUE IOHEXOL 300 MG/ML  SOLN COMPARISON:  None. FINDINGS: Lower chest: Moderate-sized hiatal hernia. Normal sized heart. Mild bibasilar linear atelectasis/scarring. Hepatobiliary: Air in the biliary tree.  Surgically absent gallbladder. Dilated common duct measuring 2.1 cm in maximum diameter. No visible gallstones or mass. Pancreas: Unremarkable. No pancreatic ductal dilatation or surrounding inflammatory changes. Spleen: Normal in size without focal abnormality. Adrenals/Urinary Tract: Adrenal glands are unremarkable. Kidneys are normal, without renal calculi, focal lesion, or hydronephrosis. Bladder is unremarkable. Stomach/Bowel: Moderate-sized hiatal hernia. Extensive colonic diverticulosis without evidence of diverticulitis. Unremarkable small bowel and appendix. Vascular/Lymphatic: No significant vascular findings are present. No enlarged abdominal or pelvic lymph nodes. Reproductive: Status post hysterectomy. No adnexal masses. Other: No abdominal wall hernia or abnormality. No abdominopelvic ascites. Musculoskeletal: Lower lumbar spine degenerative changes. IMPRESSION: 1. No acute abnormality. 2. Moderate-sized hiatal hernia. 3. Extensive colonic  diverticulosis. Electronically Signed   By: Claudie Revering M.D.   On: 11/04/2020 19:00   DG Chest Port 1 View  Result Date: 11/04/2020 CLINICAL DATA:  Questionable sepsis. Patient reports abdominal pain. EXAM: PORTABLE CHEST 1 VIEW COMPARISON:  None. FINDINGS: The cardiomediastinal contours are normal. Streaky atelectasis or scarring at the lung bases. Pulmonary vasculature is normal. No consolidation, pleural effusion, or pneumothorax. No acute osseous abnormalities are seen. IMPRESSION: Streaky atelectasis or scarring at the lung bases. No evidence of pneumonia. Electronically Signed   By: Keith Rake M.D.   On: 11/04/2020 18:57    Procedures Procedures   Medications Ordered in ED Medications  lactated ringers infusion (0 mLs Intravenous Stopped 11/04/20 1905)  acetaminophen (TYLENOL) tablet 650 mg (650 mg Oral Given 11/04/20 1559)  lactated ringers bolus 1,000 mL (0 mLs Intravenous Stopped 11/04/20 1841)    And  lactated ringers bolus 500 mL (0 mLs  Intravenous Stopped 11/04/20 1841)    And  lactated ringers bolus 250 mL (0 mLs Intravenous Stopped 11/04/20 1841)  ceFEPIme (MAXIPIME) 2 g in sodium chloride 0.9 % 100 mL IVPB (0 g Intravenous Stopped 11/04/20 1705)  metroNIDAZOLE (FLAGYL) IVPB 500 mg (0 mg Intravenous Stopped 11/04/20 1841)  iohexol (OMNIPAQUE) 300 MG/ML solution 100 mL (100 mLs Intravenous Contrast Given 11/04/20 1801)  sodium chloride 0.9 % bolus 1,000 mL (1,000 mLs Intravenous New Bag/Given 11/04/20 1907)    ED Course  I have reviewed the triage vital signs and the nursing notes.  Pertinent labs & imaging results that were available during my care of the patient were reviewed by me and considered in my medical decision making (see chart for details). CRITICAL CARE Performed by: Milton Ferguson Total critical care time: 45 minutes Critical care time was exclusive of separately billable procedures and treating other patients. Critical care was necessary to treat or prevent imminent or life-threatening deterioration. Critical care was time spent personally by me on the following activities: development of treatment plan with patient and/or surrogate as well as nursing, discussions with consultants, evaluation of patient's response to treatment, examination of patient, obtaining history from patient or surrogate, ordering and performing treatments and interventions, ordering and review of laboratory studies, ordering and review of radiographic studies, pulse oximetry and re-evaluation of patient's condition.    MDM Rules/Calculators/A&P                          Patient with abdominal pain vomiting dehydration elevated liver enzymes with hyperbilirubinemia.  Patient is also hypotensive.  Hospice is going to come see the patient and arrange admission Final Clinical Impression(s) / ED Diagnoses Final diagnoses:  Jaundice    Rx / DC Orders ED Discharge Orders    None       Milton Ferguson, MD 11/04/20 1939

## 2020-11-04 NOTE — ED Notes (Signed)
External female catheter purewick

## 2020-11-04 NOTE — Progress Notes (Signed)
A consult was received from an ED physician for Cefepime per pharmacy dosing.  The patient's profile has been reviewed for ht/wt/allergies/indication/available labs.    A one time order has already been placed by MD for Cefepime 2g IV (dose is appropriate).  Further antibiotics/pharmacy consults should be ordered by admitting physician if indicated.                       Thank you, Luiz Ochoa 11/04/2020  4:49 PM

## 2020-11-04 NOTE — Progress Notes (Signed)
Notified bedside nurse of need to draw repeat lactic acid since code sepsis just called (had one earlier).

## 2020-11-04 NOTE — Progress Notes (Signed)
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   Medical History: Past Medical History:  Diagnosis Date  . Glaucoma     Medications:  PTA Xarelto 20mg  PO daily-last dose reported as 11/03/2020 at 2030  Assessment: Paula Bentley with PMH of PE on Xarelto presenting with chills, n/v, abdominal pain. Xarelto held on admission in case procedure needed this admission. Pharmacy consulted for IV heparin dosing while Xarelto held. H/H WNL, Platelet count low at 126K. Baseline aPTT 34 seconds (WNL), PT/INR 20.3/1.8, heparin level 0.69 units/mL. Heparin level likely falsely elevated at baseline due to recent Xarelto use.   Goal of Therapy:  aPTT 66-102 seconds Heparin level 0.3-0.7 units/ml Monitor platelets by anticoagulation protocol: Yes   Plan:  Start heparin infusion at 900 units/hr aPTT and heparin level 8 hours after initiation Will use aPTT for dose titration/monitoring until heparin level and aPTT correlate Daily CBC, heparin level, aPTT Monitor closely for s/sx of bleeding   Lindell Spar M 11/04/2020,8:30 PM

## 2020-11-04 NOTE — ED Triage Notes (Signed)
Per EMS pt from home. For 5 days she has had generalized abdominal pain that comes and goes. Has not been able to keep anything down for the last couple days. States she has had chills for 5 days.  118/60 Hr 94  O2 98 RA T99.0 CBG 168

## 2020-11-04 NOTE — Progress Notes (Signed)
Per bedside RN blood cultures were drawn before antibiotics given 

## 2020-11-04 NOTE — H&P (Addendum)
History and Physical    Paula Bentley FTD:[id number] DOB: 1941/04/28 DOA: 11/04/2020  PCP: Lucianne Lei, MD   Patient coming from: Home   Chief Complaint: Chills, N/V, abdominal pain   HPI: Paula Bentley is a 80 y.o. female with medical history significant for hypertension, glaucoma, and PE on Xarelto, now presenting to the emergency department for evaluation of nausea, vomiting, chills, and abdominal pain.  Patient reports that she developed nausea and vomiting 3 days ago, has been unable to tolerate any food or drink since then, has had intermittent episodes of pain radiating from the right upper quadrant across the upper abdomen, and has developed shaking chills.  She has never experienced this previously.  She reports cholecystectomy approximately 20 years ago.  She denies diarrhea, melena, or hematochezia.  She denies chest pain or shortness of breath.  ED Course: Upon arrival to the ED, patient is found to be febrile to 40 C, saturating well on room air, and with systolic blood pressure in the 80s.  EKG features a sinus rhythm and chest x-ray notable for streaky atelectasis or scarring at the bases.  Chemistry panel notable for alkaline phosphatase 128, AST 300, ALT 224, and total bilirubin 7.0.  CBC with mild thrombocytopenia.  Lactic acid normal.  Fecal occult blood testing negative.  CT of the abdomen and pelvis with air in the biliary tree and ductal dilatation which could be related to prior cholecystectomy and age.  Blood cultures were collected in the ED, 30 cc/kg LR bolus was given, and the patient was started on cefepime and Flagyl.  Review of Systems:  All other systems reviewed and apart from HPI, are negative.  Past Medical History:  Diagnosis Date  . Glaucoma     Past Surgical History:  Procedure Laterality Date  . ABDOMINAL HYSTERECTOMY    . CHOLECYSTECTOMY      Social History:   reports that she has never smoked. She has never used smokeless  tobacco. She reports that she does not drink alcohol and does not use drugs.  No Known Allergies  Family History  Problem Relation Age of Onset  . Diabetes Neg Hx      Prior to Admission medications   Medication Sig Start Date End Date Taking? Authorizing Provider  acetaminophen (TYLENOL) 325 MG tablet Take 650 mg by mouth every 6 (six) hours as needed for mild pain, fever or headache.   Yes [provider]  acetaminophen (TYLENOL) 650 MG CR tablet Take 650 mg by mouth every 8 (eight) hours as needed for pain.   Yes [provider]  dorzolamide-timolol (COSOPT) 22.3-6.8 MG/ML ophthalmic solution Place 1 drop into both eyes 2 (two) times daily. 09/12/20  Yes [provider]  latanoprost (XALATAN) 0.005 % ophthalmic solution Place 1 drop into both eyes at bedtime. 11/01/20  Yes [provider]  losartan (COZAAR) 25 MG tablet Take 25 mg by mouth daily. 09/29/20  Yes [provider]  trolamine salicylate (ASPERCREME) 10 % cream Apply 1 application topically as needed for muscle pain.   Yes [provider]  XARELTO 20 MG TABS tablet Take 20 mg by mouth daily. 10/19/20  Yes [provider]    Physical Exam: Vitals:   11/04/20 1730 11/04/20 1745 11/04/20 1850 11/04/20 1945  BP: (!) 81/59 (!) 84/59 (!) 80/49 (!) 97/54  Pulse: 91 92 92 83  Resp: (!) 25 (!) 29 (!) 25   Temp:  97.7 F (36.5 C) 97.7 F (36.5 C) 98.2  F (36.8 C)  TempSrc:   Oral Oral  SpO2: 97% 96% 97% 99%  Weight:      Height:        Constitutional: NAD, calm  Eyes: PERTLA, lids and conjunctivae normal ENMT: Mucous membranes are moist. Posterior pharynx clear of any exudate or lesions.   Neck: supple, no masses  Respiratory: no wheezing, no crackles. No accessory muscle use.  Cardiovascular: S1 & S2 heard, regular rate and rhythm. No extremity edema.   Abdomen: No distension, no tenderness, soft. Bowel sounds active.  Musculoskeletal: no clubbing / cyanosis. No  joint deformity upper and lower extremities.   Skin: no significant rashes, lesions, ulcers. Warm, dry, well-perfused. Neurologic: CN 2-12 grossly intact. Sensation intact. Moving all extremities.  Psychiatric: Alert and oriented to person, place, and situation. Pleasant and cooperative.    Labs and Imaging on Admission: I have personally reviewed following labs and imaging studies  CBC: Recent Labs  Lab 11/04/20 1515  WBC 5.1  NEUTROABS 4.9  HGB 13.8  HCT 40.4  MCV 92.7  PLT 737*   Basic Metabolic Panel: Recent Labs  Lab 11/04/20 1515  NA 139  K 3.5  CL 108  CO2 18*  GLUCOSE 167*  BUN 20  CREATININE 0.83  CALCIUM 9.2   GFR: Estimated Creatinine Clearance: 44.7 mL/min (by C-G formula based on SCr of 0.83 mg/dL). Liver Function Tests: Recent Labs  Lab 11/04/20 1515  AST 300*  ALT 224*  ALKPHOS 128*  BILITOT 7.0*  PROT 6.7  ALBUMIN 3.2*   No results for input(s): LIPASE, AMYLASE in the last 168 hours. No results for input(s): AMMONIA in the last 168 hours. Coagulation Profile: Recent Labs  Lab 11/04/20 1631  INR 1.8*   Cardiac Enzymes: No results for input(s): CKTOTAL, CKMB, CKMBINDEX, TROPONINI in the last 168 hours. BNP (last 3 results) No results for input(s): PROBNP in the last 8760 hours. HbA1C: No results for input(s): HGBA1C in the last 72 hours. CBG: No results for input(s): GLUCAP in the last 168 hours. Lipid Profile: No results for input(s): CHOL, HDL, LDLCALC, TRIG, CHOLHDL, LDLDIRECT in the last 72 hours. Thyroid Function Tests: No results for input(s): TSH, T4TOTAL, FREET4, T3FREE, THYROIDAB in the last 72 hours. Anemia Panel: No results for input(s): VITAMINB12, FOLATE, FERRITIN, TIBC, IRON, RETICCTPCT in the last 72 hours. Urine analysis: No results found for: COLORURINE, APPEARANCEUR, LABSPEC, PHURINE, GLUCOSEU, HGBUR, BILIRUBINUR, KETONESUR, PROTEINUR, UROBILINOGEN, NITRITE, LEUKOCYTESUR Sepsis  Labs: '@LABRCNTIP' (procalcitonin:4,lacticidven:4) ) Recent Results (from the past 240 hour(s))  Resp Panel by RT-PCR (Flu A&B, Covid) Nasopharyngeal Swab     Status: None   Collection Time: 11/04/20  4:31 PM   Specimen: Nasopharyngeal Swab; Nasopharyngeal(NP) swabs in vial transport medium  Result Value Ref Range Status   SARS Coronavirus 2 by RT PCR NEGATIVE NEGATIVE Final    Comment: (NOTE) SARS-CoV-2 target nucleic acids are NOT DETECTED.  The SARS-CoV-2 RNA is generally detectable in upper respiratory specimens during the acute phase of infection. The lowest concentration of SARS-CoV-2 viral copies this assay can detect is 138 copies/mL. A negative result does not preclude SARS-Cov-2 infection and should not be used as the sole basis for treatment or other patient management decisions. A negative result may occur with  improper specimen collection/handling, submission of specimen other than nasopharyngeal swab, presence of viral mutation(s) within the areas targeted by this assay, and inadequate number of viral copies(<138 copies/mL). A negative result must be combined with clinical observations, patient history, and epidemiological information. The  expected result is Negative.  Fact Sheet for Patients:  EntrepreneurPulse.com.au  Fact Sheet for Healthcare Providers:  IncredibleEmployment.be  This test is no t yet approved or cleared by the [place] FDA and  has been authorized for detection and/or diagnosis of SARS-CoV-2 by FDA under an Emergency Use Authorization (EUA). This EUA will remain  in effect (meaning this test can be used) for the duration of the COVID-19 declaration under Section 564(b)(1) of the Act, 21 U.S.C.section 360bbb-3(b)(1), unless the authorization is terminated  or revoked sooner.       Influenza A by PCR NEGATIVE NEGATIVE Final   Influenza B by PCR NEGATIVE NEGATIVE Final    Comment: (NOTE) The Xpert Xpress  SARS-CoV-2/FLU/RSV plus assay is intended as an aid in the diagnosis of influenza from Nasopharyngeal swab specimens and should not be used as a sole basis for treatment. Nasal washings and aspirates are unacceptable for Xpert Xpress SARS-CoV-2/FLU/RSV testing.  Fact Sheet for Patients: EntrepreneurPulse.com.au  Fact Sheet for Healthcare Providers: IncredibleEmployment.be  This test is not yet approved or cleared by the [place] FDA and has been authorized for detection and/or diagnosis of SARS-CoV-2 by FDA under an Emergency Use Authorization (EUA). This EUA will remain in effect (meaning this test can be used) for the duration of the COVID-19 declaration under Section 564(b)(1) of the Act, 21 U.S.C. section 360bbb-3(b)(1), unless the authorization is terminated or revoked.  Performed at [facility], [place] [address], [place], [place] [address]   Blood Culture (routine x 2)     Status: None (Preliminary result)   Collection Time: 11/04/20  4:36 PM   Specimen: BLOOD RIGHT ARM  Result Value Ref Range Status   Specimen Description   Final    BLOOD RIGHT ARM Performed at [facility], [place] [address], [place], [place] [address]    Special Requests   Final    BOTTLES DRAWN AEROBIC AND ANAEROBIC Blood Culture results may not be optimal due to an inadequate volume of blood received in culture bottles Performed at [facility] [address], [place], [place] [address]    Culture PENDING  Incomplete   Report Status PENDING  Incomplete     Radiological Exams on Admission: CT ABDOMEN PELVIS W CONTRAST  Result Date: 11/04/2020 CLINICAL DATA:  Intermittent diffuse abdominal pain for the past 5 days. Associated chills. Nausea and vomiting for the past 2 days. EXAM: CT ABDOMEN AND PELVIS WITH CONTRAST TECHNIQUE: Multidetector CT imaging of the abdomen and pelvis was performed using the standard protocol  following bolus administration of intravenous contrast. CONTRAST:  159m OMNIPAQUE IOHEXOL 300 MG/ML  SOLN COMPARISON:  None. FINDINGS: Lower chest: Moderate-sized hiatal hernia. Normal sized heart. Mild bibasilar linear atelectasis/scarring. Hepatobiliary: Air in the biliary tree. Surgically absent gallbladder. Dilated common duct measuring 2.1 cm in maximum diameter. No visible gallstones or mass. Pancreas: Unremarkable. No pancreatic ductal dilatation or surrounding inflammatory changes. Spleen: Normal in size without focal abnormality. Adrenals/Urinary Tract: Adrenal glands are unremarkable. Kidneys are normal, without renal calculi, focal lesion, or hydronephrosis. Bladder is unremarkable. Stomach/Bowel: Moderate-sized hiatal hernia. Extensive colonic diverticulosis without evidence of diverticulitis. Unremarkable small bowel and appendix. Vascular/Lymphatic: No significant vascular findings are present. No enlarged abdominal or pelvic lymph nodes. Reproductive: Status post hysterectomy. No adnexal masses. Other: No abdominal wall hernia or abnormality. No abdominopelvic ascites. Musculoskeletal: Lower lumbar spine degenerative changes. IMPRESSION: 1. No acute abnormality. 2. Moderate-sized hiatal hernia. 3. Extensive colonic diverticulosis. Electronically Signed  By: Claudie Revering M.D.   On: 11/04/2020 19:00   DG Chest Port 1 View  Result Date: 11/04/2020 CLINICAL DATA:  Questionable sepsis. Patient reports abdominal pain. EXAM: PORTABLE CHEST 1 VIEW COMPARISON:  None. FINDINGS: The cardiomediastinal contours are normal. Streaky atelectasis or scarring at the lung bases. Pulmonary vasculature is normal. No consolidation, pleural effusion, or pneumothorax. No acute osseous abnormalities are seen. IMPRESSION: Streaky atelectasis or scarring at the lung bases. No evidence of pneumonia. Electronically Signed   By: Keith Rake M.D.   On: 11/04/2020 18:57    EKG: Independently reviewed. Sinus rhythm.    Assessment/Plan   1. Sepsis  - Presents with 3 days of N/V, rigors, and intermitting pain radiating from RUQ around upper abdomen and is found to be febrile, hypotensive, and jaundiced concerning for cholangitis  - Blood cultures were collected in ED, 30 cc/kg of LR was given and she was started on cefepime and Flagyl  - CT findings discussed with radiology, MRCP recommended  - Continue cefepime and Flagyl, continue IVF and NPO, check MRCP, follow cultures    2. Elevated LFTs  - Alk phos 128, AST 300, ALT 224, t bili 7.0, INR 1.8 (on Xarelto)  - Abdomen is soft and non-tender in ED; discussed CT results with radiology and MRCP recommended  - Continue cefepime and Flagyl as above, check lipase, trend LFTs, check MRCP    3. Hypertension  - Hypotensive in ED, hold losartan   4. History of PE  - Patient reports hx of PE ~2 yrs ago in [place], is unsure of precipitating factor  - Hold Xarelto and use heparin for now     DVT prophylaxis: Xarelto pta, IV heparin for now  Code Status: Full, confirmed with patient  Level of Care: Level of care: Stepdown Family Communication: Sister, Lelon Frohlich, updated at bedside  Disposition Plan:  Patient is from: Home  Anticipated d/c is to: Home  Anticipated d/c date is: 11/07/20 Patient currently: Pending MRCP, cultures, clinical improvement  Consults called: none Admission status: Inpatient     Vianne Bulls, MD [facility]  11/04/2020, 8:03 PM

## 2020-11-05 ENCOUNTER — Inpatient Hospital Stay (HOSPITAL_COMMUNITY): Payer: Medicare Other

## 2020-11-05 LAB — COMPREHENSIVE METABOLIC PANEL
ALT: 148 U/L — ABNORMAL HIGH (ref 0–44)
AST: 125 U/L — ABNORMAL HIGH (ref 15–41)
Albumin: 2.6 g/dL — ABNORMAL LOW (ref 3.5–5.0)
Alkaline Phosphatase: 79 U/L (ref 38–126)
Anion gap: 6 (ref 5–15)
BUN: 18 mg/dL (ref 8–23)
CO2: 21 mmol/L — ABNORMAL LOW (ref 22–32)
Calcium: 8.2 mg/dL — ABNORMAL LOW (ref 8.9–10.3)
Chloride: 112 mmol/L — ABNORMAL HIGH (ref 98–111)
Creatinine, Ser: 0.77 mg/dL (ref 0.44–1.00)
GFR, Estimated: >60 mL/min (ref >60)
Glucose, Bld: 133 mg/dL — ABNORMAL HIGH (ref 70–99)
Potassium: 3.4 mmol/L — ABNORMAL LOW (ref 3.5–5.1)
Sodium: 139 mmol/L (ref 135–145)
Total Bilirubin: 3.5 mg/dL — ABNORMAL HIGH (ref 0.3–1.2)
Total Protein: 5.6 g/dL — ABNORMAL LOW (ref 6.5–8.1)

## 2020-11-05 LAB — CBC
HCT: 32.9 % — ABNORMAL LOW (ref 36.0–46.0)
Hemoglobin: 11.2 g/dL — ABNORMAL LOW (ref 12.0–15.0)
MCH: 32 pg (ref 26.0–34.0)
MCHC: 34 g/dL (ref 30.0–36.0)
MCV: 94 fL (ref 80.0–100.0)
Platelets: 109 10*3/uL — ABNORMAL LOW (ref 150–400)
RBC: 3.5 MIL/uL — ABNORMAL LOW (ref 3.87–5.11)
RDW: 14.6 % (ref 11.5–15.5)
WBC: 17.4 10*3/uL — ABNORMAL HIGH (ref 4.0–10.5)
nRBC: 0 % (ref 0.0–0.2)

## 2020-11-05 LAB — HEPARIN LEVEL (UNFRACTIONATED)
Heparin Unfractionated: >1.1 IU/mL — ABNORMAL HIGH (ref 0.30–0.70)
Heparin Unfractionated: 0.43 IU/mL (ref 0.30–0.70)

## 2020-11-05 LAB — LACTIC ACID, PLASMA: Lactic Acid, Venous: 1.6 mmol/L (ref 0.5–1.9)

## 2020-11-05 LAB — APTT
aPTT: >200 seconds (ref 24–36)
aPTT: 77 seconds — ABNORMAL HIGH (ref 24–36)

## 2020-11-05 LAB — MRSA PCR SCREENING: MRSA by PCR: NEGATIVE

## 2020-11-05 IMAGING — MR MR ABDOMEN WO/W CM MRCP
16 of 20 series · 37 of 48 positions shown · IV contrast (gadavist)
Comparison: Abdomen/pelvis CT [DATE]

CLINICAL DATA: Jaundice. Nausea vomiting for 3 days. Intermittent
upper abdominal pain.



[Series 2: DWI · axial · 6.0mm · 1.49mm/px · z∈[-156,+96]mm · 3 of 72 slices shown (1 of 2)]
[im 1/72]
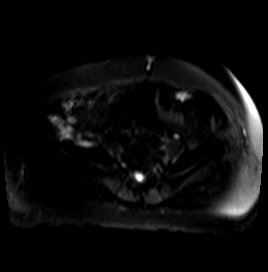
[im 36/72]
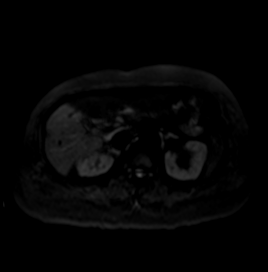
[im 72/72]
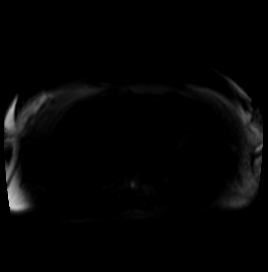

[Series 3: DWI · axial · 6.0mm · 1.49mm/px · 1 of 36 slices shown (2 of 2)]
[im 1/36]
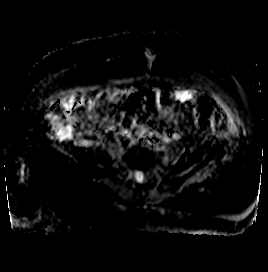

[Series 4: T2 fat-sat · axial · 6.0mm · 1.25mm/px · 1 of 40 slices shown]
[im 1/40]
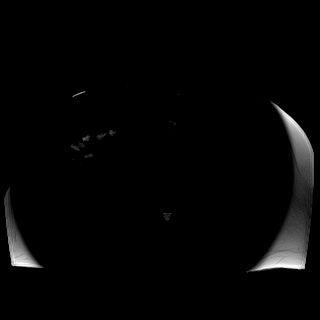

[Series 7: cor_3d_spc_trig · coronal · 1.0mm · 0.49mm/px · 3 of 88 slices shown]
[im 1/88]
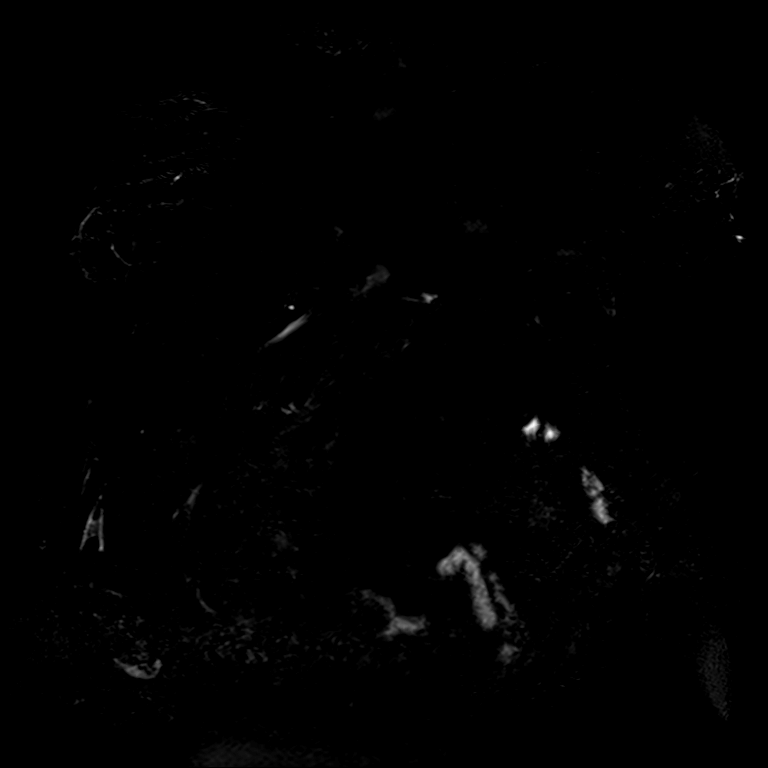
[im 44/88]
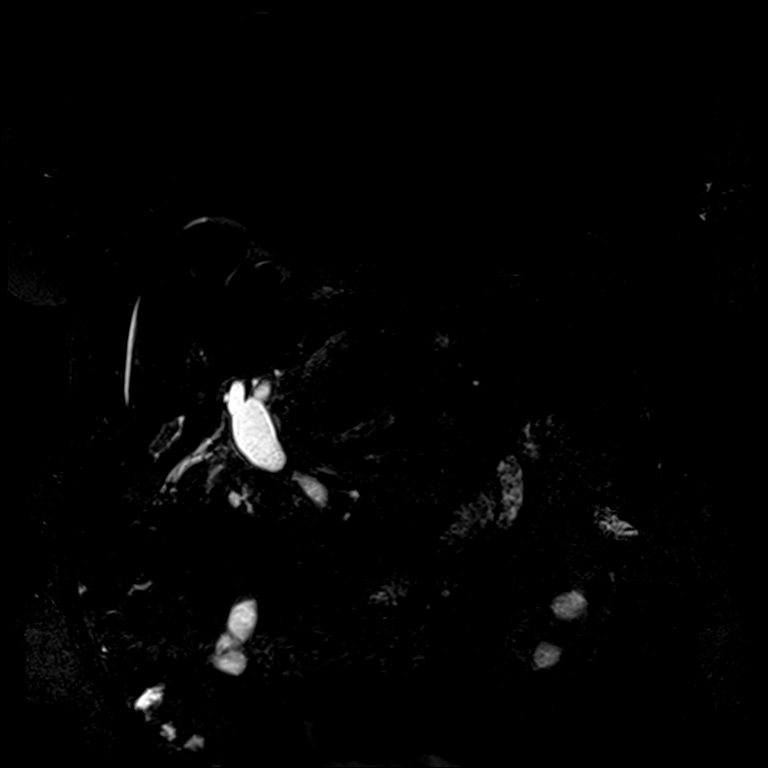
[im 88/88]
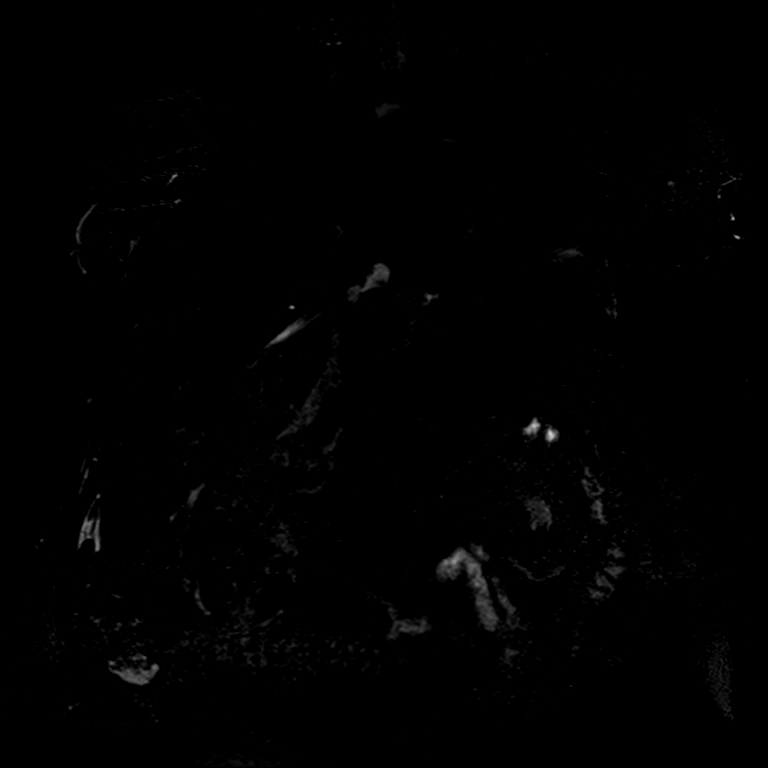

[Series 11: T2 · coronal · 6.0mm · 1.48mm/px · 1 of 32 slices shown (1 of 2)]
[im 1/32]
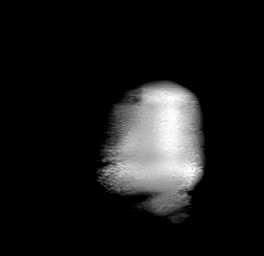

[Series 12: T1 · axial · 3.0mm · 1.25mm/px · z∈[-137,+100]mm · 3 of 80 slices shown (1 of 2)]
[im 1/80]
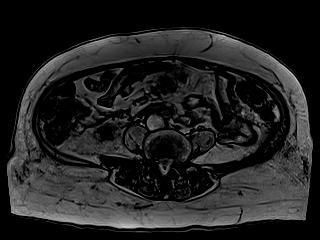
[im 40/80]
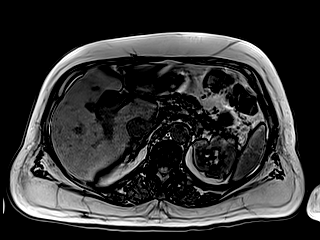
[im 80/80]
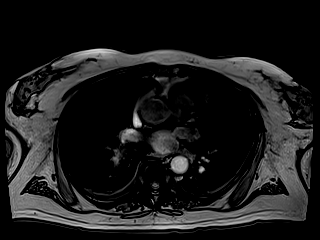

[Series 13: T1 · axial · 3.0mm · 1.25mm/px · z∈[-137,+100]mm · 3 of 80 slices shown (2 of 2)]
[im 1/80]
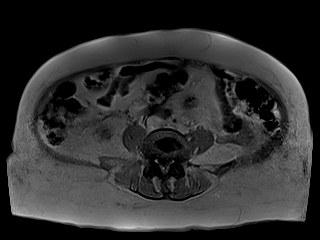
[im 40/80]
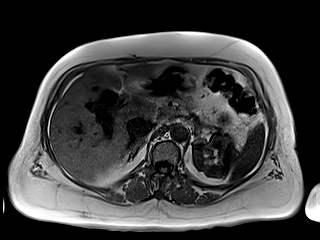
[im 80/80]
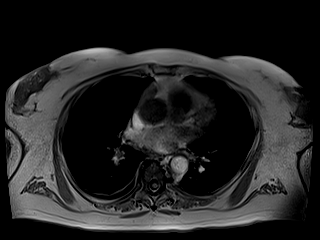

[Series 14: cor obl thk · sagittal · 50.0mm · 0.78mm/px · 1 of 9 slices shown]
[im 1/9]
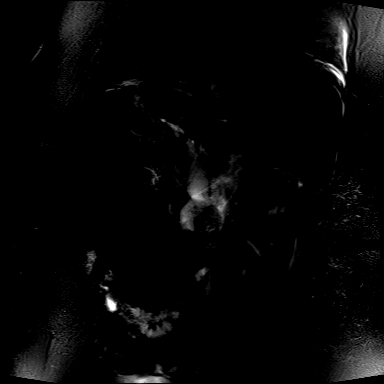

[Series 15: T2 · axial · 6.0mm · 1.56mm/px · 1 of 37 slices shown (2 of 2)]
[im 1/37]
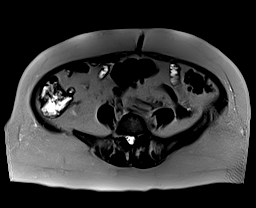

[Series 17: T1 dynamic · axial · 3.0mm · 1.25mm/px · z∈[-161,+100]mm · 3 of 88 slices shown (1 of 7)]
[im 1/88]
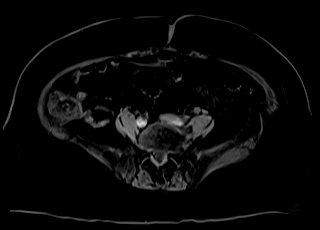
[im 44/88]
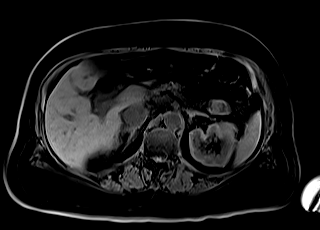
[im 88/88]
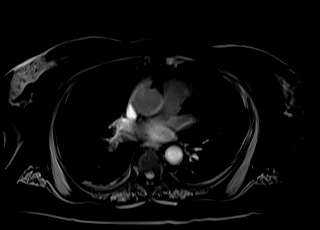

[Series 21: T1 dynamic · axial · 3.0mm · 1.25mm/px · z∈[-161,+100]mm · 3 of 88 slices shown (2 of 7)]
[im 1/88]
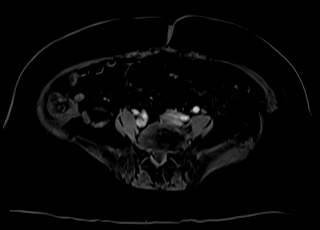
[im 44/88]
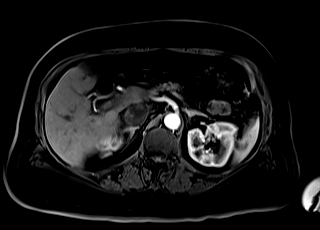
[im 88/88]
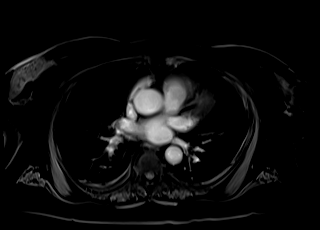

[Series 22: T1 dynamic · axial · 3.0mm · 1.25mm/px · z∈[-161,+100]mm · 3 of 88 slices shown (3 of 7)]
[im 1/88]
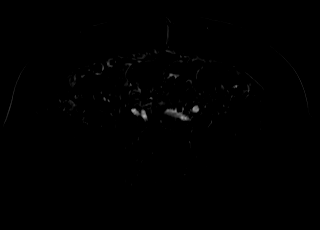
[im 44/88]
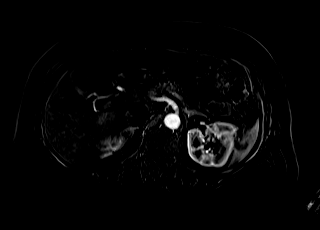
[im 88/88]
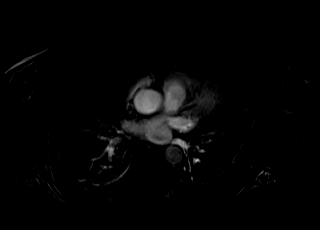

[Series 25: T1 dynamic · axial · 3.0mm · 1.25mm/px · z∈[-161,+100]mm · 3 of 88 slices shown (4 of 7)]
[im 1/88]
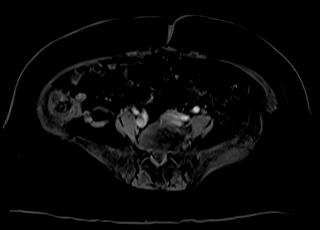
[im 44/88]
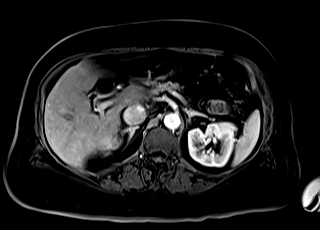
[im 88/88]
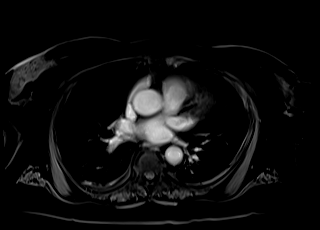

[Series 26: T1 dynamic · axial · 3.0mm · 1.25mm/px · z∈[-161,+100]mm · 3 of 88 slices shown (5 of 7)]
[im 1/88]
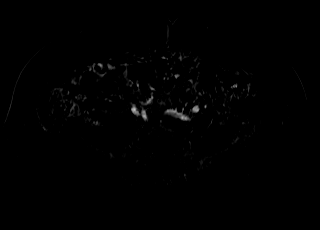
[im 44/88]
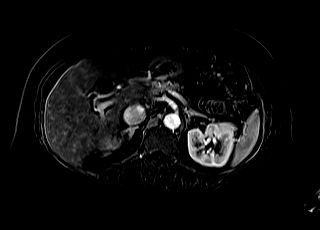
[im 88/88]
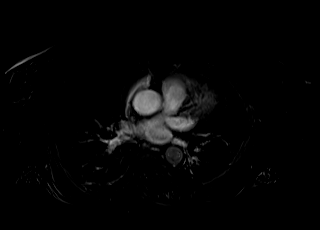

[Series 30: T1 dynamic · axial · 3.0mm · 1.25mm/px · z∈[-161,+100]mm · 3 of 88 slices shown (6 of 7)]
[im 1/88]
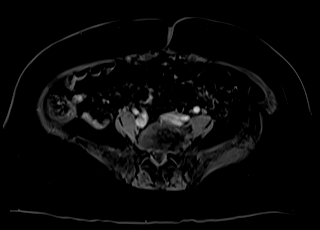
[im 44/88]
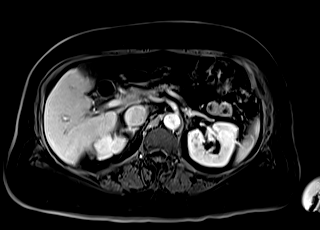
[im 88/88]
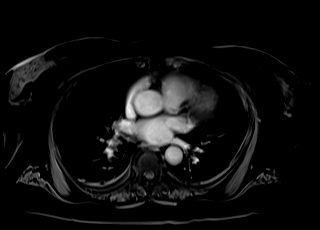

[Series 31: T1 dynamic · axial · 3.0mm · 1.25mm/px · z∈[-161,-32]mm · 2 of 88 slices shown (7 of 7)]
[im 1/88]
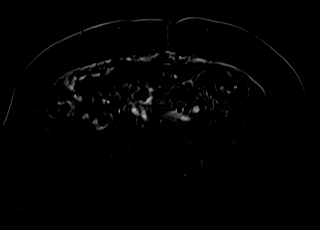
[im 44/88]
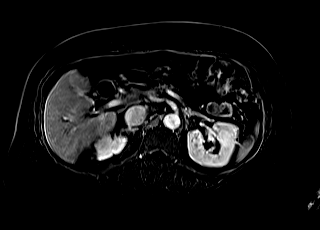

[37 of 48 positions shown; findings below may reference images not displayed]

FINDINGS: Lower chest: Small to moderate hiatal hernia.

Hepatobiliary: Liver measures 17.6 cm craniocaudal length, mildly
enlarged. Periportal edema is associated with mild intrahepatic
biliary duct dilatation, most prominently in the lateral segment
left liver. Left hepatic duct is dilated up to 2 cm diameter with
evidence of pneumobilia. Common duct measures 1.4 cm diameter in the
porta hepatis with a possible tiny filling defect (axial T2 image
[DATE]). There is abrupt cut off of the common bile duct in the head
of the pancreas just proximal to the ampulla.

As noted on CT, a bilobed 4.4 x 2.4 cm lesion is identified in the
posterior left liver (segment II). This lesion restricts diffusion
shows subtle low level T2 hyperintensity and is similar in signal
intensity to liver parenchyma on precontrast imaging. Postcontrast
imaging is degraded by proximity to the heart generating substantial
motion artifact in this portion of the liver, but the lesion does
appear to heterogeneously enhance.

Pancreas: No focal mass lesion. No dilatation of the main duct. No
intraparenchymal cyst. No peripancreatic edema.

Spleen:  No splenomegaly. No focal mass lesion.

Adrenals/Urinary Tract: No adrenal nodule or mass. Tiny foci of
hypoenhancement in both kidneys are too small to characterize, but
likely benign.

Stomach/Bowel: Small to moderate hiatal hernia. No small bowel or
colonic dilatation within the visualized abdomen. Diverticular
disease is seen diffusely in the visualize abdominal segments of the
colon.

Vascular/Lymphatic: No abdominal aortic aneurysm. Main portal vein
and right portal vein are patent. Patency of the left portal vein
was better demonstrated on CT yesterday and although attenuated
appears to be patent on today's study. There is no gastrohepatic or
hepatoduodenal ligament lymphadenopathy. No retroperitoneal or
mesenteric lymphadenopathy.

Other: Trace perihepatic fluid with edema in Morison's pouch region
in the hepatoduodenal ligament.

Musculoskeletal: No focal suspicious marrow enhancement within the
visualized bony anatomy.
IMPRESSION: 1. Left greater than right intrahepatic biliary duct dilatation with
pneumobilia. The common bile duct is dilated up to 14 mm diameter
and there may be a punctate filling defect in the mid common bile
duct. Abrupt cut off of the common bile duct in the head of the
pancreas just proximal to the ampulla without obstructive etiology
evident by MRI.
[DATE] x 2.4 cm bilobed lesion in the posterior left liver (segment
II). Imaging features are nonspecific. Primary hepatic neoplasm or
metastatic disease is a concern.
3. Periportal edema with edema in in the hepatoduodenal ligament.
Trace fluid adjacent to the liver.
4. Patency of the left portal vein was better demonstrated on CT
yesterday and appears to be patent on today's study.
5. Small to moderate hiatal hernia.
6. Colonic diverticulosis.

## 2020-11-05 IMAGING — MR MR 3D RECON AT SCANNER
16 of 20 series · 37 of 48 positions shown · IV contrast (gadavist)
Comparison: Abdomen/pelvis CT [DATE]

CLINICAL DATA: Jaundice. Nausea vomiting for 3 days. Intermittent
upper abdominal pain.



[Series 2: DWI · axial · 6.0mm · 1.49mm/px · z∈[-156,+96]mm · 3 of 72 slices shown (1 of 2)]
[im 1/72]
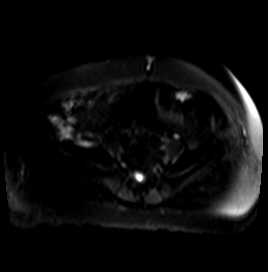
[im 36/72]
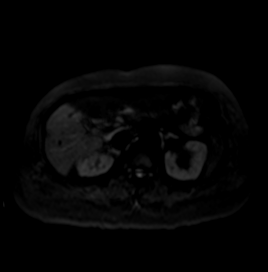
[im 72/72]
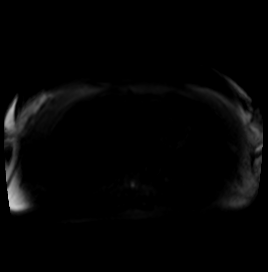

[Series 3: DWI · axial · 6.0mm · 1.49mm/px · 1 of 36 slices shown (2 of 2)]
[im 1/36]
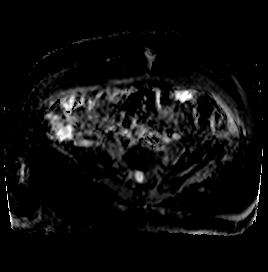

[Series 4: T2 fat-sat · axial · 6.0mm · 1.25mm/px · 1 of 40 slices shown]
[im 1/40]
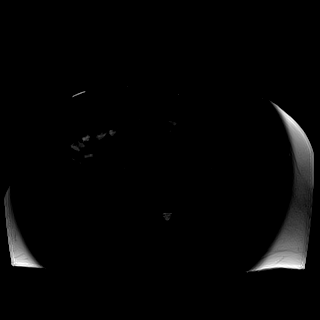

[Series 7: cor_3d_spc_trig · coronal · 1.0mm · 0.49mm/px · 3 of 88 slices shown]
[im 1/88]
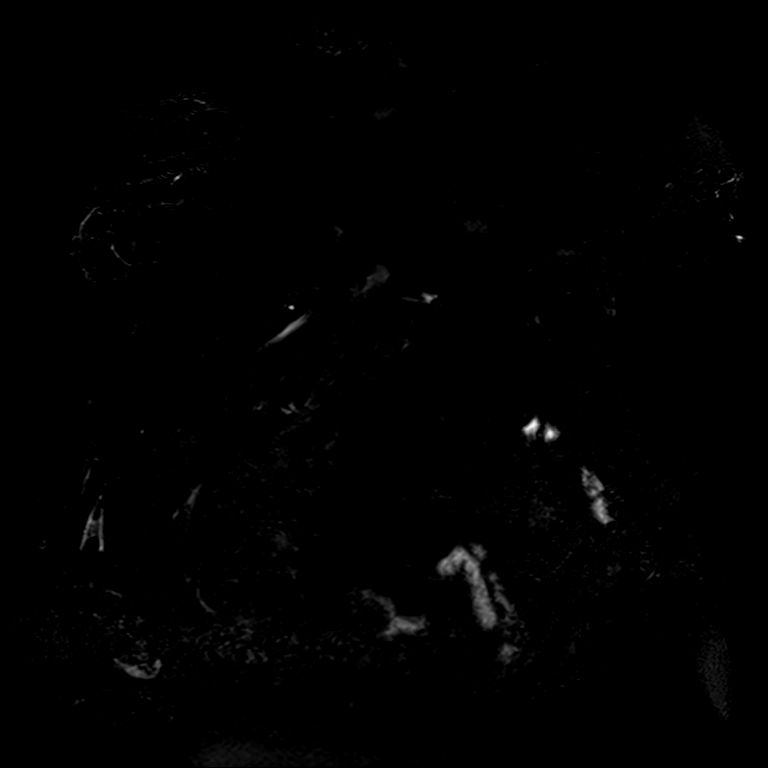
[im 44/88]
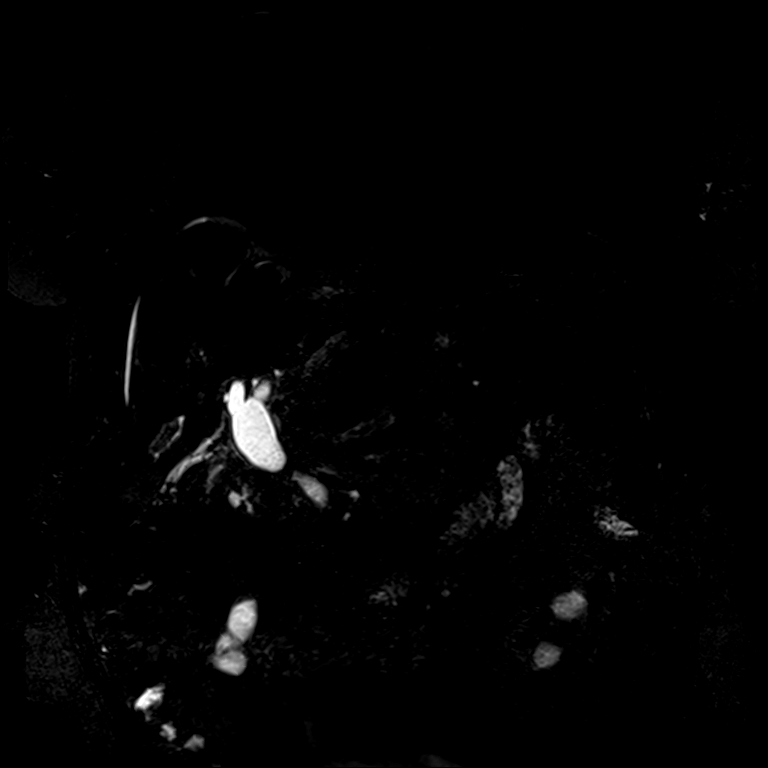
[im 88/88]
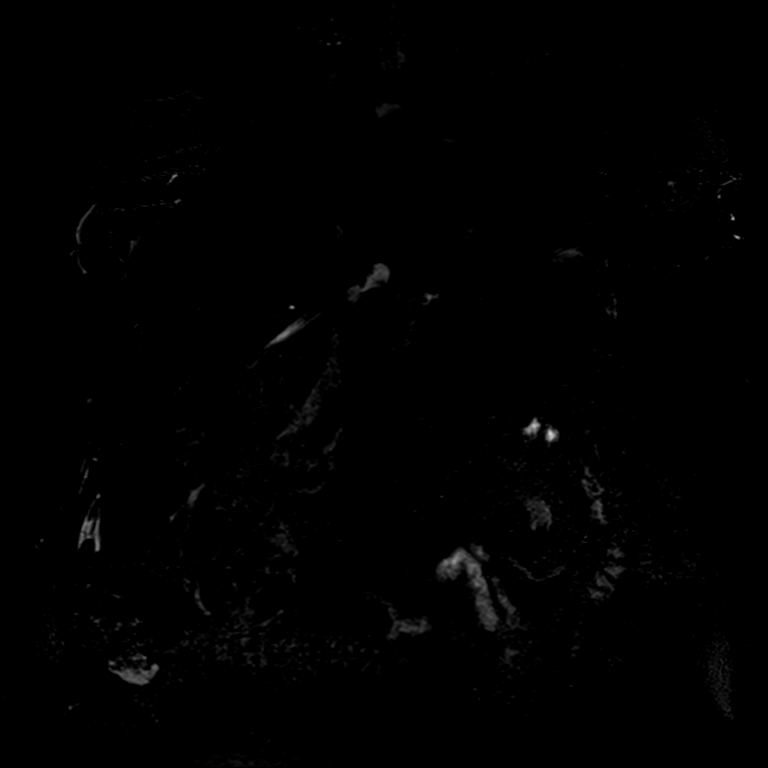

[Series 11: T2 · coronal · 6.0mm · 1.48mm/px · 1 of 32 slices shown (1 of 2)]
[im 1/32]
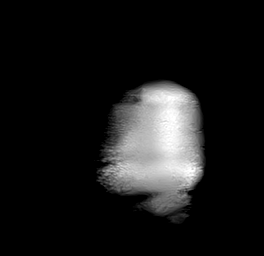

[Series 12: T1 · axial · 3.0mm · 1.25mm/px · z∈[-137,+100]mm · 3 of 80 slices shown (1 of 2)]
[im 1/80]
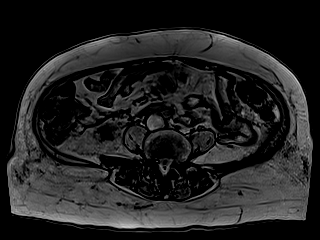
[im 40/80]
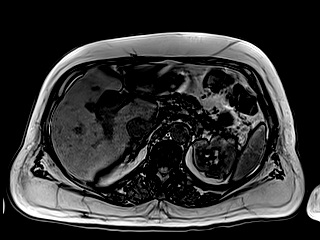
[im 80/80]
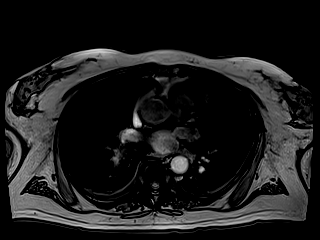

[Series 13: T1 · axial · 3.0mm · 1.25mm/px · z∈[-137,+100]mm · 3 of 80 slices shown (2 of 2)]
[im 1/80]
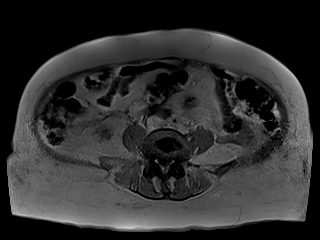
[im 40/80]
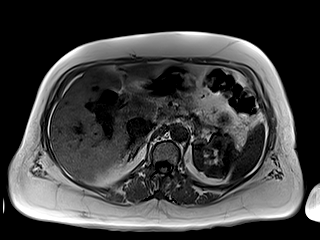
[im 80/80]
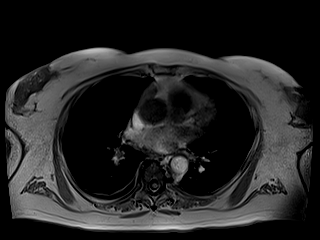

[Series 14: cor obl thk · sagittal · 50.0mm · 0.78mm/px · 1 of 9 slices shown]
[im 1/9]
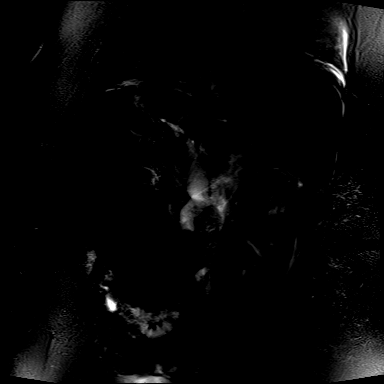

[Series 15: T2 · axial · 6.0mm · 1.56mm/px · 1 of 37 slices shown (2 of 2)]
[im 1/37]
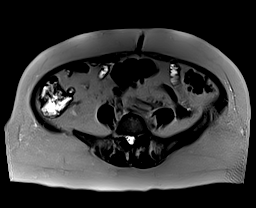

[Series 17: T1 dynamic · axial · 3.0mm · 1.25mm/px · z∈[-161,+100]mm · 3 of 88 slices shown (1 of 7)]
[im 1/88]
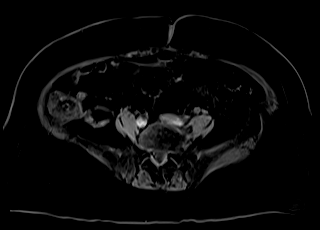
[im 44/88]
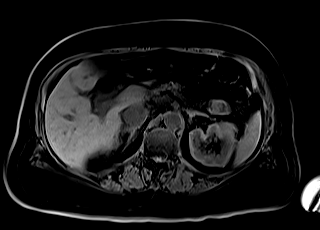
[im 88/88]
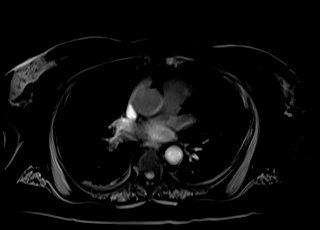

[Series 21: T1 dynamic · axial · 3.0mm · 1.25mm/px · z∈[-161,+100]mm · 3 of 88 slices shown (2 of 7)]
[im 1/88]
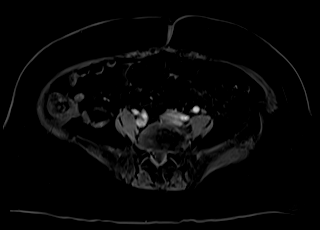
[im 44/88]
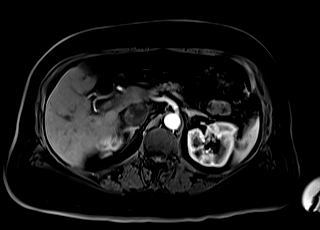
[im 88/88]
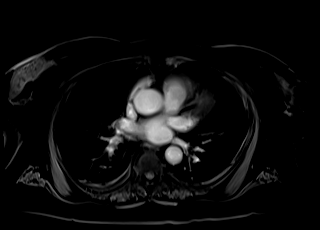

[Series 22: T1 dynamic · axial · 3.0mm · 1.25mm/px · z∈[-161,+100]mm · 3 of 88 slices shown (3 of 7)]
[im 1/88]
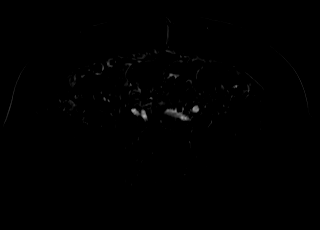
[im 44/88]
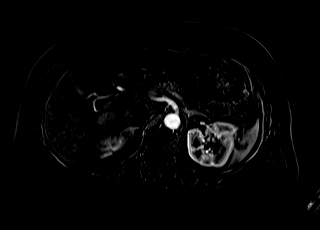
[im 88/88]
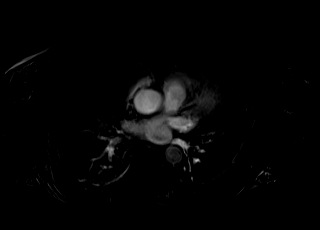

[Series 25: T1 dynamic · axial · 3.0mm · 1.25mm/px · z∈[-161,+100]mm · 3 of 88 slices shown (4 of 7)]
[im 1/88]
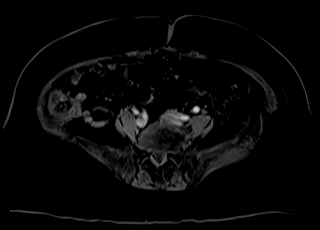
[im 44/88]
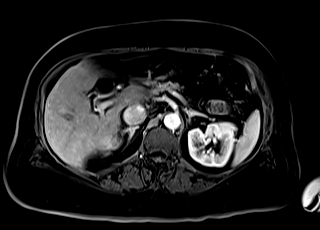
[im 88/88]
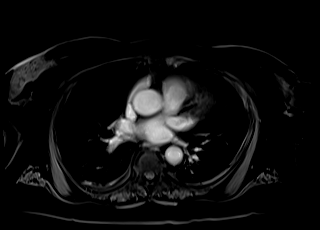

[Series 26: T1 dynamic · axial · 3.0mm · 1.25mm/px · z∈[-161,+100]mm · 3 of 88 slices shown (5 of 7)]
[im 1/88]
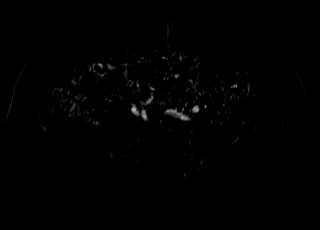
[im 44/88]
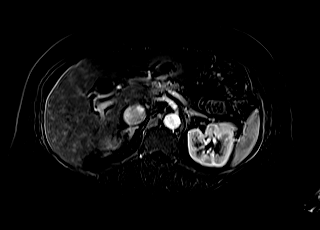
[im 88/88]
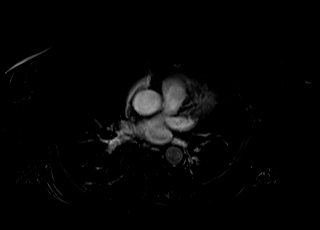

[Series 30: T1 dynamic · axial · 3.0mm · 1.25mm/px · z∈[-161,+100]mm · 3 of 88 slices shown (6 of 7)]
[im 1/88]
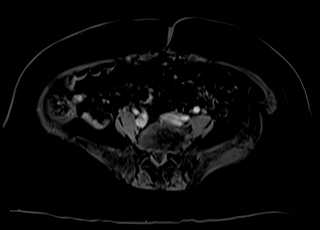
[im 44/88]
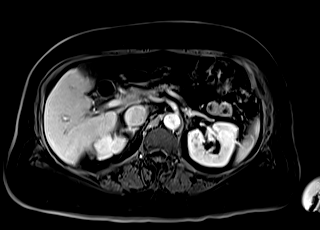
[im 88/88]
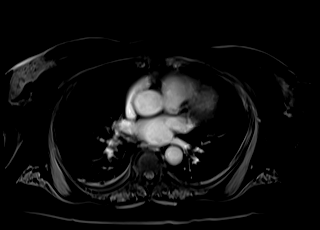

[Series 31: T1 dynamic · axial · 3.0mm · 1.25mm/px · z∈[-161,-32]mm · 2 of 88 slices shown (7 of 7)]
[im 1/88]
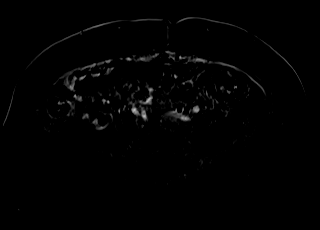
[im 44/88]
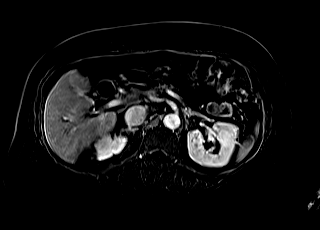

[37 of 48 positions shown; findings below may reference images not displayed]

FINDINGS: Lower chest: Small to moderate hiatal hernia.

Hepatobiliary: Liver measures 17.6 cm craniocaudal length, mildly
enlarged. Periportal edema is associated with mild intrahepatic
biliary duct dilatation, most prominently in the lateral segment
left liver. Left hepatic duct is dilated up to 2 cm diameter with
evidence of pneumobilia. Common duct measures 1.4 cm diameter in the
porta hepatis with a possible tiny filling defect (axial T2 image
[DATE]). There is abrupt cut off of the common bile duct in the head
of the pancreas just proximal to the ampulla.

As noted on CT, a bilobed 4.4 x 2.4 cm lesion is identified in the
posterior left liver (segment II). This lesion restricts diffusion
shows subtle low level T2 hyperintensity and is similar in signal
intensity to liver parenchyma on precontrast imaging. Postcontrast
imaging is degraded by proximity to the heart generating substantial
motion artifact in this portion of the liver, but the lesion does
appear to heterogeneously enhance.

Pancreas: No focal mass lesion. No dilatation of the main duct. No
intraparenchymal cyst. No peripancreatic edema.

Spleen:  No splenomegaly. No focal mass lesion.

Adrenals/Urinary Tract: No adrenal nodule or mass. Tiny foci of
hypoenhancement in both kidneys are too small to characterize, but
likely benign.

Stomach/Bowel: Small to moderate hiatal hernia. No small bowel or
colonic dilatation within the visualized abdomen. Diverticular
disease is seen diffusely in the visualize abdominal segments of the
colon.

Vascular/Lymphatic: No abdominal aortic aneurysm. Main portal vein
and right portal vein are patent. Patency of the left portal vein
was better demonstrated on CT yesterday and although attenuated
appears to be patent on today's study. There is no gastrohepatic or
hepatoduodenal ligament lymphadenopathy. No retroperitoneal or
mesenteric lymphadenopathy.

Other: Trace perihepatic fluid with edema in Morison's pouch region
in the hepatoduodenal ligament.

Musculoskeletal: No focal suspicious marrow enhancement within the
visualized bony anatomy.
IMPRESSION: 1. Left greater than right intrahepatic biliary duct dilatation with
pneumobilia. The common bile duct is dilated up to 14 mm diameter
and there may be a punctate filling defect in the mid common bile
duct. Abrupt cut off of the common bile duct in the head of the
pancreas just proximal to the ampulla without obstructive etiology
evident by MRI.
[DATE] x 2.4 cm bilobed lesion in the posterior left liver (segment
II). Imaging features are nonspecific. Primary hepatic neoplasm or
metastatic disease is a concern.
3. Periportal edema with edema in in the hepatoduodenal ligament.
Trace fluid adjacent to the liver.
4. Patency of the left portal vein was better demonstrated on CT
yesterday and appears to be patent on today's study.
5. Small to moderate hiatal hernia.
6. Colonic diverticulosis.

## 2020-11-05 MED ORDER — CHLORHEXIDINE GLUCONATE CLOTH 2 % EX PADS
6.0000 | MEDICATED_PAD | Freq: Every day | CUTANEOUS | Status: DC
  Administered 2020-11-05 – 2020-11-08 (×4): 6 via TOPICAL

## 2020-11-05 MED ORDER — GADOBUTROL 1 MMOL/ML IV SOLN
5.0000 mL | Freq: Once | INTRAVENOUS | Status: AC | PRN
  Administered 2020-11-05: 5 mL via INTRAVENOUS

## 2020-11-05 MED ORDER — SODIUM CHLORIDE 0.9 % IV SOLN: INTRAVENOUS | Status: DC

## 2020-11-05 MED ORDER — POTASSIUM CHLORIDE 10 MEQ/100ML IV SOLN
INTRAVENOUS | Status: AC
  Administered 2020-11-05: 10 meq via INTRAVENOUS
  Filled 2020-11-05: qty 100

## 2020-11-05 MED ORDER — POTASSIUM CHLORIDE 10 MEQ/100ML IV SOLN
10.0000 meq | INTRAVENOUS | Status: AC
Start: 2020-11-05 — End: 2020-11-05
  Administered 2020-11-05 (×2): 10 meq via INTRAVENOUS
  Filled 2020-11-05 (×2): qty 100

## 2020-11-05 NOTE — Progress Notes (Signed)
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)  No Known Allergies  Patient Measurements: Height: 5\' 3"  (160 cm) Weight: 54.4 kg (120 lb) IBW/kg (Calculated) : 52.4 Heparin Dosing Weight: actual body weight   Vital Signs: Temp: 97.8 F (36.6 C) (06/08 0729) Temp Source: Oral (06/08 0729) BP: 97/62 (06/08 0600) Pulse Rate: 46 (06/08 0600)  Labs: Recent Labs    11/04/20 1515 11/04/20 1631 11/05/20 0500 11/05/20 0717  HGB 13.8  --  11.2*  --   HCT 40.4  --  32.9*  --   PLT 126*  --  109*  --   APTT  --  34 >200*  --   LABPROT  --  20.6*  --   --   INR  --  1.8*  --   --   HEPARINUNFRC  --  0.69 >1.10* 0.43  CREATININE 0.83  --  0.77  --     Estimated Creatinine Clearance: 46.4 mL/min (by C-G formula based on SCr of 0.77 mg/dL).   Medical History: Past Medical History:  Diagnosis Date  . Glaucoma     Medications:  PTA Xarelto 20mg  PO daily-last dose reported as 11/03/2020 at 2030  Assessment: 80 y/oF with PMH of PE on Xarelto presenting with chills, n/v, abdominal pain. Xarelto held on admission in case procedure needed this admission. Pharmacy consulted for IV heparin dosing while Xarelto held. H/H WNL, Platelet count low at 126K. Baseline aPTT 34 seconds (WNL), PT/INR 20.3/1.8, heparin level 0.69 units/mL. Heparin level likely falsely elevated at baseline due to recent Xarelto use.   Today, 11/05/2020:  0500 heparin level and aPTT grossly elevated; per RN, labs drawn at 0500 today were drawn on the same arm just proximal to heparin infusion, making them unreliable  Heparin turned off at 0615 for MRI and not resumed until 0711 this morning  Heparin level and aPTT redrawn at 0717 (immediately after a 1-hr pause in infusion) from opposite arm were both therapeutic  No bleeding or other infusion issues noted per RN  CBC: Hgb slightly low, down 2.5 g from admission (likely dilutional); Plt low on  admission, slightly down this AM  SCr stable/WNL, at baseline  Goal of Therapy:  aPTT 66-102 seconds Heparin level 0.3-0.7 units/ml Monitor platelets by anticoagulation protocol: Yes   Plan:   Continue heparin infusion at 900 units/hr - suspect heparin levels are somewhat below true steady state d/t 1-hr pause, but given levels on lower end of the therapeutic range, should be safe to continue at this rate  Check confirmatory heparin level and aPTT in 8 hr  Will use aPTT for dose titration/monitoring until heparin level and aPTT correlate  Daily CBC, heparin level, aPTT  Monitor closely for s/sx of bleeding  Reuel Boom, PharmD, BCPS [phone] 11/05/2020, 7:47 AM

## 2020-11-05 NOTE — Progress Notes (Signed)
PROGRESS NOTE    Paula Bentley  MLY:[id number] DOB: [birth year] DOA: 11/04/2020 PCP: Lucianne Lei, MD   Chief Complain: Chills, nausea, vomiting, abdominal pain  Brief Narrative: Patient is 80 year old female with history of hypertension, glaucoma, PE on Xarelto who presented to the emergency room with complaints of nausea, vomiting, chills, abdominal pain.  She developed the symptoms 3 days ago and was unable to tolerate any food or drink.  Pain was mostly across the right upper quadrant.  She has history of cholecystectomy  20 years ago.  On arrival she was febrile with fever of 40 C, hypotensive. chest x-ray did not show pneumonia.  Lab work showed elevated liver enzymes along with T bili.  Lipase was normal.  CT abdomen/pelvis showed air in the biliary tree,dilated CBD .  Patient was admitted for septic shock secondary to biliary source.  MRCP showed pneumobilia, dilated CBD.  GI consulted today.  Assessment & Plan:   Principal Problem:   Sepsis ([place]) Active Problems:   History of pulmonary embolism   Hypertension   Elevated LFTs   Septic shock: Presented with hypotension, high-grade fever, chills.  Started on broad-spectrum antibiotics, blood cultures sent, started on IV fluids.  Patient is hemodynamically stable, afebrile this morning.  Blood pressure significantly improved.  Sepsis is thought to be secondary to biliary source.  Continue current antibiotic regimen.  She has leukocytosis.  Cholangitis/possible choledocholithiasis: Elevated liver enzymes with T bili on presentation.  Liver enzymes improving.  MRCP showed left greater than right intrahepatic biliary duct dilatation with pneumobilia. CBD of  14 mm diameter and possible   punctate filling defect in the mid common bile Duct.  GI consulted.  Possible plan for ERCP tomorrow.  Currently on clear liquid diet .abdominal pain has significantly improved and she does not complain of any nausea or vomiting today.  Liver  lesion: MRI also showed 4.4 x 2.4 cm bilobed lesion in the posterior left liver ,  features are nonspecific. Primary hepatic neoplasm or metastatic disease cannot be ruled out ,will F/U GI recommendation. Will check alpha-fetoprotein.  History of PE: On Xarelto at home.  Started on heparin drip by the admitting physician.  Currently on hold due to thrombocytopenia.  History of hypertension: Hypotensive on presentation.  Antihypertensives on hold.  Hypokalemia: Supplemented with potassium  Thrombocytopenia: Most likely associated  with sepsis.  Continue to monitor.  Will discontinue heparin drip .  Glaucoma: Continue home eyedrops.          DVT prophylaxis:SCD Code Status: Full Family Communication: Called sister on phone today, call not received Status is: Inpatient  Remains inpatient appropriate because:IV treatments appropriate due to intensity of illness or inability to take PO and Inpatient level of care appropriate due to severity of illness   Dispo: The patient is from: Home              Anticipated d/c is to: Home              Patient currently is not medically stable to d/c.   Difficult to place patient No    Consultants: GI  Procedures:MRCP  Antimicrobials:  Anti-infectives (From admission, onward)   Start     Dose/Rate Route Frequency Ordered Stop   11/05/20 0400  ceFEPIme (MAXIPIME) 2 g in sodium chloride 0.9 % 100 mL IVPB        2 g 200 mL/hr over 30 Minutes Intravenous Every 12 hours 11/04/20 2027     11/05/20 0100  metroNIDAZOLE (FLAGYL)  IVPB 500 mg        500 mg 100 mL/hr over 60 Minutes Intravenous Every 8 hours 11/04/20 2002     11/04/20 1645  ceFEPIme (MAXIPIME) 2 g in sodium chloride 0.9 % 100 mL IVPB        2 g 200 mL/hr over 30 Minutes Intravenous  Once 11/04/20 1631 11/04/20 1705   11/04/20 1645  metroNIDAZOLE (FLAGYL) IVPB 500 mg        500 mg 100 mL/hr over 60 Minutes Intravenous  Once 11/04/20 1631 11/04/20 1841       Subjective:  Patient seen and examined at the bedside this morning.  Very comfortable during my evaluation.  She was hypotensive earlier this morning but when I evaluated her she was hemodynamically stable, afebrile.  Blood pressure has improved.  She denies any abdomen, nausea or vomiting.   Objective: Vitals:   11/05/20 0530 11/05/20 0600 11/05/20 0729 11/05/20 0903  BP: 94/68 97/62    Pulse: (!) 50 (!) 46    Resp: (!) 24 17    Temp:   97.8 F (36.6 C) 98.2 F (36.8 C)  TempSrc:   Oral Oral  SpO2: 98% 99%    Weight:      Height:        Intake/Output Summary (Last 24 hours) at 11/05/2020 1120 Last data filed at 11/05/2020 0438 Gross per 24 hour  Intake 1100 ml  Output --  Net 1100 ml   Filed Weights   11/04/20 1414  Weight: 54.4 kg    Examination:  General exam: Appears calm and comfortable ,Not in distress, pleasant elderly female HEENT:PERRL,Oral mucosa moist, Ear/Nose normal on gross exam Respiratory system: Bilateral equal air entry, normal vesicular breath sounds, no wheezes or crackles  Cardiovascular system: S1 & S2 heard, RRR. No JVD, murmurs, rubs, gallops or clicks. No pedal edema. Gastrointestinal system: Abdomen is nondistended, soft and nontender. No organomegaly or masses felt. Normal bowel sounds heard. Central nervous system: Alert and oriented. No focal neurological deficits. Extremities: No edema, no clubbing ,no cyanosis Skin: No rashes, lesions or ulcers,no icterus ,no pallor    Data Reviewed: I have personally reviewed following labs and imaging studies  CBC: Recent Labs  Lab 11/04/20 1515 11/05/20 0500  WBC 5.1 17.4*  NEUTROABS 4.9  --   HGB 13.8 11.2*  HCT 40.4 32.9*  MCV 92.7 94.0  PLT 126* 762*   Basic Metabolic Panel: Recent Labs  Lab 11/04/20 1515 11/05/20 0500  NA 139 139  K 3.5 3.4*  CL 108 112*  CO2 18* 21*  GLUCOSE 167* 133*  BUN 20 18  CREATININE 0.83 0.77  CALCIUM 9.2 8.2*   GFR: Estimated Creatinine  Clearance: 46.4 mL/min (by C-G formula based on SCr of 0.77 mg/dL). Liver Function Tests: Recent Labs  Lab 11/04/20 1515 11/05/20 0500  AST 300* 125*  ALT 224* 148*  ALKPHOS 128* 79  BILITOT 7.0* 3.5*  PROT 6.7 5.6*  ALBUMIN 3.2* 2.6*   Recent Labs  Lab 11/04/20 1515  LIPASE 18   No results for input(s): AMMONIA in the last 168 hours. Coagulation Profile: Recent Labs  Lab 11/04/20 1631  INR 1.8*   Cardiac Enzymes: No results for input(s): CKTOTAL, CKMB, CKMBINDEX, TROPONINI in the last 168 hours. BNP (last 3 results) No results for input(s): PROBNP in the last 8760 hours. HbA1C: No results for input(s): HGBA1C in the last 72 hours. CBG: No results for input(s): GLUCAP in the last 168 hours. Lipid Profile: No  results for input(s): CHOL, HDL, LDLCALC, TRIG, CHOLHDL, LDLDIRECT in the last 72 hours. Thyroid Function Tests: No results for input(s): TSH, T4TOTAL, FREET4, T3FREE, THYROIDAB in the last 72 hours. Anemia Panel: No results for input(s): VITAMINB12, FOLATE, FERRITIN, TIBC, IRON, RETICCTPCT in the last 72 hours. Sepsis Labs: Recent Labs  Lab 11/04/20 1515 11/05/20 0912  PROCALCITON 4.81  --   LATICACIDVEN 1.6 1.6    Recent Results (from the past 240 hour(s))  Resp Panel by RT-PCR (Flu A&B, Covid) Nasopharyngeal Swab     Status: None   Collection Time: 11/04/20  4:31 PM   Specimen: Nasopharyngeal Swab; Nasopharyngeal(NP) swabs in vial transport medium  Result Value Ref Range Status   SARS Coronavirus 2 by RT PCR NEGATIVE NEGATIVE Final    Comment: (NOTE) SARS-CoV-2 target nucleic acids are NOT DETECTED.  The SARS-CoV-2 RNA is generally detectable in upper respiratory specimens during the acute phase of infection. The lowest concentration of SARS-CoV-2 viral copies this assay can detect is 138 copies/mL. A negative result does not preclude SARS-Cov-2 infection and should not be used as the sole basis for treatment or other patient management decisions.  A negative result may occur with  improper specimen collection/handling, submission of specimen other than nasopharyngeal swab, presence of viral mutation(s) within the areas targeted by this assay, and inadequate number of viral copies(<138 copies/mL). A negative result must be combined with clinical observations, patient history, and epidemiological information. The expected result is Negative.  Fact Sheet for Patients:  EntrepreneurPulse.com.au  Fact Sheet for Healthcare Providers:  IncredibleEmployment.be  This test is no t yet approved or cleared by the [place] FDA and  has been authorized for detection and/or diagnosis of SARS-CoV-2 by FDA under an Emergency Use Authorization (EUA). This EUA will remain  in effect (meaning this test can be used) for the duration of the COVID-19 declaration under Section 564(b)(1) of the Act, 21 U.S.C.section 360bbb-3(b)(1), unless the authorization is terminated  or revoked sooner.       Influenza A by PCR NEGATIVE NEGATIVE Final   Influenza B by PCR NEGATIVE NEGATIVE Final    Comment: (NOTE) The Xpert Xpress SARS-CoV-2/FLU/RSV plus assay is intended as an aid in the diagnosis of influenza from Nasopharyngeal swab specimens and should not be used as a sole basis for treatment. Nasal washings and aspirates are unacceptable for Xpert Xpress SARS-CoV-2/FLU/RSV testing.  Fact Sheet for Patients: EntrepreneurPulse.com.au  Fact Sheet for Healthcare Providers: IncredibleEmployment.be  This test is not yet approved or cleared by the [place] FDA and has been authorized for detection and/or diagnosis of SARS-CoV-2 by FDA under an Emergency Use Authorization (EUA). This EUA will remain in effect (meaning this test can be used) for the duration of the COVID-19 declaration under Section 564(b)(1) of the Act, 21 U.S.C. section 360bbb-3(b)(1), unless the authorization  is terminated or revoked.  Performed at [facility], [place] [address] [place], [place] [address]   Blood Culture (routine x 2)     Status: None (Preliminary result)   Collection Time: 11/04/20  4:31 PM   Specimen: BLOOD  Result Value Ref Range Status   Specimen Description   Final    BLOOD RIGHT ANTECUBITAL Performed at [facility] [address], [place], [place] [address]    Special Requests   Final    BOTTLES DRAWN AEROBIC AND ANAEROBIC Blood Culture results may not be optimal due to an inadequate volume of blood received in culture bottles Performed at [facility]  [place] [address], [place], [place] [address]    Culture   Final    NO GROWTH < 12 HOURS Performed at [facility] [address], [place], [place] [address]    Report Status PENDING  Incomplete  Blood Culture (routine x 2)     Status: None (Preliminary result)   Collection Time: 11/04/20  4:36 PM   Specimen: BLOOD RIGHT ARM  Result Value Ref Range Status   Specimen Description   Final    BLOOD RIGHT ARM Performed at [facility], [place] [address], [place], [place] [address]    Special Requests   Final    BOTTLES DRAWN AEROBIC AND ANAEROBIC Blood Culture results may not be optimal due to an inadequate volume of blood received in culture bottles Performed at [facility] [address], [place], [place] [address]    Culture   Final    NO GROWTH < 12 HOURS Performed at [facility] [address], [place], [place] [address]    Report Status PENDING  Incomplete         Radiology Studies: CT ABDOMEN PELVIS W CONTRAST  Addendum Date: 11/04/2020   ADDENDUM REPORT: 11/04/2020 20:28 ADDENDUM: There is also mild to moderate intrahepatic biliary ductal dilatation the lateral segment of the left lobe of the liver with ill-defined decreased density in that portion of the liver as well as a better defined heterogeneous area  of low density measuring 5.7 x 3.6 cm on image number 12/2 and 2.3 cm in length on coronal image number 45/5. There is also a suggestion of a noncalcified distal common duct stone containing nitrogen in the distal common duct on image number 35/2, measuring 8 mm in diameter. Therefore, there should be additional impressions as follows: 1. Biliary ductal dilatation out of proportion for what is expected for an 80 year old patient with a previous cholecystectomy with a possible noncalcified distal common duct obstructing stone. Further evaluation with MRCP is recommended. 2. Ill-defined low density in the lateral segment of the left lobe of the liver where there is more intrahepatic biliary ductal dilatation as well as a 5.7 x 3.6 x 2.3 cm more discrete area of heterogeneous low density in the lateral segment of the left lobe of the liver. This could represent hepatic infection or malignancy. This could also be further evaluated at the time of MRCP. 3. The biliary air could be due to biliary infection, recent stone passage or previous sphincterotomy. Note: The changes in the report have been discussed with Dr. Marnette Burgess. Electronically Signed   By: Claudie Revering M.D.   On: 11/04/2020 20:28   Result Date: 11/04/2020 CLINICAL DATA:  Intermittent diffuse abdominal pain for the past 5 days. Associated chills. Nausea and vomiting for the past 2 days. EXAM: CT ABDOMEN AND PELVIS WITH CONTRAST TECHNIQUE: Multidetector CT imaging of the abdomen and pelvis was performed using the standard protocol following bolus administration of intravenous contrast. CONTRAST:  166mL OMNIPAQUE IOHEXOL 300 MG/ML  SOLN COMPARISON:  None. FINDINGS: Lower chest: Moderate-sized hiatal hernia. Normal sized heart. Mild bibasilar linear atelectasis/scarring. Hepatobiliary: Air in the biliary tree. Surgically absent gallbladder. Dilated common duct measuring 2.1 cm in maximum diameter. No visible gallstones or mass. Pancreas: Unremarkable. No pancreatic  ductal dilatation or surrounding inflammatory changes. Spleen: Normal in size without focal abnormality. Adrenals/Urinary Tract: Adrenal glands are unremarkable. Kidneys are normal, without renal calculi, focal lesion, or hydronephrosis. Bladder is unremarkable. Stomach/Bowel: Moderate-sized hiatal  hernia. Extensive colonic diverticulosis without evidence of diverticulitis. Unremarkable small bowel and appendix. Vascular/Lymphatic: No significant vascular findings are present. No enlarged abdominal or pelvic lymph nodes. Reproductive: Status post hysterectomy. No adnexal masses. Other: No abdominal wall hernia or abnormality. No abdominopelvic ascites. Musculoskeletal: Lower lumbar spine degenerative changes. IMPRESSION: 1. No acute abnormality. 2. Moderate-sized hiatal hernia. 3. Extensive colonic diverticulosis. Electronically Signed: By: Claudie Revering M.D. On: 11/04/2020 19:00   MR 3D Recon At Scanner  Result Date: 11/05/2020 CLINICAL DATA:  Jaundice. Nausea vomiting for 3 days. Intermittent upper abdominal pain. EXAM: MRI ABDOMEN WITHOUT AND WITH CONTRAST (INCLUDING MRCP) TECHNIQUE: Multiplanar multisequence MR imaging of the abdomen was performed both before and after the administration of intravenous contrast. Heavily T2-weighted images of the biliary and pancreatic ducts were obtained, and three-dimensional MRCP images were rendered by post processing. CONTRAST:  56mL GADAVIST GADOBUTROL 1 MMOL/ML IV SOLN COMPARISON:  Abdomen/pelvis CT 11/04/2020 FINDINGS: Lower chest: Small to moderate hiatal hernia. Hepatobiliary: Liver measures 17.6 cm craniocaudal length, mildly enlarged. Periportal edema is associated with mild intrahepatic biliary duct dilatation, most prominently in the lateral segment left liver. Left hepatic duct is dilated up to 2 cm diameter with evidence of pneumobilia. Common duct measures 1.4 cm diameter in the porta hepatis with a possible tiny filling defect (axial T2 image 24/4). There is  abrupt cut off of the common bile duct in the head of the pancreas just proximal to the ampulla. As noted on CT, a bilobed 4.4 x 2.4 cm lesion is identified in the posterior left liver (segment II). This lesion restricts diffusion shows subtle low level T2 hyperintensity and is similar in signal intensity to liver parenchyma on precontrast imaging. Postcontrast imaging is degraded by proximity to the heart generating substantial motion artifact in this portion of the liver, but the lesion does appear to heterogeneously enhance. Pancreas: No focal mass lesion. No dilatation of the main duct. No intraparenchymal cyst. No peripancreatic edema. Spleen:  No splenomegaly. No focal mass lesion. Adrenals/Urinary Tract: No adrenal nodule or mass. Tiny foci of hypoenhancement in both kidneys are too small to characterize, but likely benign. Stomach/Bowel: Small to moderate hiatal hernia. No small bowel or colonic dilatation within the visualized abdomen. Diverticular disease is seen diffusely in the visualize abdominal segments of the colon. Vascular/Lymphatic: No abdominal aortic aneurysm. Main portal vein and right portal vein are patent. Patency of the left portal vein was better demonstrated on CT yesterday and although attenuated appears to be patent on today's study. There is no gastrohepatic or hepatoduodenal ligament lymphadenopathy. No retroperitoneal or mesenteric lymphadenopathy. Other: Trace perihepatic fluid with edema in Morison's pouch region in the hepatoduodenal ligament. Musculoskeletal: No focal suspicious marrow enhancement within the visualized bony anatomy. IMPRESSION: 1. Left greater than right intrahepatic biliary duct dilatation with pneumobilia. The common bile duct is dilated up to 14 mm diameter and there may be a punctate filling defect in the mid common bile duct. Abrupt cut off of the common bile duct in the head of the pancreas just proximal to the ampulla without obstructive etiology evident  by MRI. 2. 4.4 x 2.4 cm bilobed lesion in the posterior left liver (segment II). Imaging features are nonspecific. Primary hepatic neoplasm or metastatic disease is a concern. 3. Periportal edema with edema in in the hepatoduodenal ligament. Trace fluid adjacent to the liver. 4. Patency of the left portal vein was better demonstrated on CT yesterday and appears to be patent on today's study. 5. Small to moderate  hiatal hernia. 6. Colonic diverticulosis. Electronically Signed   By: Misty Stanley M.D.   On: 11/05/2020 07:56   DG Chest Port 1 View  Result Date: 11/04/2020 CLINICAL DATA:  Questionable sepsis. Patient reports abdominal pain. EXAM: PORTABLE CHEST 1 VIEW COMPARISON:  None. FINDINGS: The cardiomediastinal contours are normal. Streaky atelectasis or scarring at the lung bases. Pulmonary vasculature is normal. No consolidation, pleural effusion, or pneumothorax. No acute osseous abnormalities are seen. IMPRESSION: Streaky atelectasis or scarring at the lung bases. No evidence of pneumonia. Electronically Signed   By: Keith Rake M.D.   On: 11/04/2020 18:57   MR ABDOMEN MRCP W WO CONTAST  Result Date: 11/05/2020 CLINICAL DATA:  Jaundice. Nausea vomiting for 3 days. Intermittent upper abdominal pain. EXAM: MRI ABDOMEN WITHOUT AND WITH CONTRAST (INCLUDING MRCP) TECHNIQUE: Multiplanar multisequence MR imaging of the abdomen was performed both before and after the administration of intravenous contrast. Heavily T2-weighted images of the biliary and pancreatic ducts were obtained, and three-dimensional MRCP images were rendered by post processing. CONTRAST:  20mL GADAVIST GADOBUTROL 1 MMOL/ML IV SOLN COMPARISON:  Abdomen/pelvis CT 11/04/2020 FINDINGS: Lower chest: Small to moderate hiatal hernia. Hepatobiliary: Liver measures 17.6 cm craniocaudal length, mildly enlarged. Periportal edema is associated with mild intrahepatic biliary duct dilatation, most prominently in the lateral segment left liver. Left  hepatic duct is dilated up to 2 cm diameter with evidence of pneumobilia. Common duct measures 1.4 cm diameter in the porta hepatis with a possible tiny filling defect (axial T2 image 24/4). There is abrupt cut off of the common bile duct in the head of the pancreas just proximal to the ampulla. As noted on CT, a bilobed 4.4 x 2.4 cm lesion is identified in the posterior left liver (segment II). This lesion restricts diffusion shows subtle low level T2 hyperintensity and is similar in signal intensity to liver parenchyma on precontrast imaging. Postcontrast imaging is degraded by proximity to the heart generating substantial motion artifact in this portion of the liver, but the lesion does appear to heterogeneously enhance. Pancreas: No focal mass lesion. No dilatation of the main duct. No intraparenchymal cyst. No peripancreatic edema. Spleen:  No splenomegaly. No focal mass lesion. Adrenals/Urinary Tract: No adrenal nodule or mass. Tiny foci of hypoenhancement in both kidneys are too small to characterize, but likely benign. Stomach/Bowel: Small to moderate hiatal hernia. No small bowel or colonic dilatation within the visualized abdomen. Diverticular disease is seen diffusely in the visualize abdominal segments of the colon. Vascular/Lymphatic: No abdominal aortic aneurysm. Main portal vein and right portal vein are patent. Patency of the left portal vein was better demonstrated on CT yesterday and although attenuated appears to be patent on today's study. There is no gastrohepatic or hepatoduodenal ligament lymphadenopathy. No retroperitoneal or mesenteric lymphadenopathy. Other: Trace perihepatic fluid with edema in Morison's pouch region in the hepatoduodenal ligament. Musculoskeletal: No focal suspicious marrow enhancement within the visualized bony anatomy. IMPRESSION: 1. Left greater than right intrahepatic biliary duct dilatation with pneumobilia. The common bile duct is dilated up to 14 mm diameter and  there may be a punctate filling defect in the mid common bile duct. Abrupt cut off of the common bile duct in the head of the pancreas just proximal to the ampulla without obstructive etiology evident by MRI. 2. 4.4 x 2.4 cm bilobed lesion in the posterior left liver (segment II). Imaging features are nonspecific. Primary hepatic neoplasm or metastatic disease is a concern. 3. Periportal edema with edema in in the hepatoduodenal  ligament. Trace fluid adjacent to the liver. 4. Patency of the left portal vein was better demonstrated on CT yesterday and appears to be patent on today's study. 5. Small to moderate hiatal hernia. 6. Colonic diverticulosis. Electronically Signed   By: Misty Stanley M.D.   On: 11/05/2020 07:56   US Abdomen Limited RUQ (LIVER/GB)  Result Date: 11/04/2020 CLINICAL DATA:  Jaundice, history of cholecystectomy EXAM: ULTRASOUND ABDOMEN LIMITED RIGHT UPPER QUADRANT COMPARISON:  11/04/2020 CT abdomen/pelvis FINDINGS: Gallbladder: Surgically absent. Common bile duct: Diameter: 20 mm. No stones in the visualized bile ducts, noting nonvisualization of the distal CBD due to overlying bowel gas. Liver: No focal lesion identified. Within normal limits in parenchymal echogenicity. Portal vein is patent on color Doppler imaging with normal direction of blood flow towards the liver. Other: None. IMPRESSION: Prominent extrahepatic biliary ductal dilatation with CBD diameter 20 mm. Suggest MRI abdomen with MRCP without and with IV contrast for further evaluation to exclude obstructing choledocholithiasis. Electronically Signed   By: Ilona Sorrel M.D.   On: 11/04/2020 20:33        Scheduled Meds: . Chlorhexidine Gluconate Cloth  6 each Topical Daily  . dorzolamide-timolol  1 drop Both Eyes BID  . latanoprost  1 drop Both Eyes QHS   Continuous Infusions: . ceFEPime (MAXIPIME) IV 2 g (11/05/20 0507)  . heparin 900 Units/hr (11/05/20 0711)  . metronidazole Stopped (11/05/20 0438)  . potassium  chloride 10 mEq (11/05/20 1040)     LOS: 1 day    Time spent: 35 mins.More than 50% of that time was spent in counseling and/or coordination of care.      Shelly Coss, MD [facility] P6/12/2020, 11:20 AM

## 2020-11-05 NOTE — Progress Notes (Signed)
PHARMACY NOTE -  Cefepime  Pharmacy has been assisting with dosing of cefepime for intra-abdominal infection.  Dosage remains stable at 2g IV q12 hr and further renal adjustments per institutional Pharmacy antibiotic protocol  Pharmacy will sign off, following peripherally for culture results or dose adjustments. Please reconsult if a change in clinical status warrants re-evaluation of dosage.  Reuel Boom, PharmD, BCPS [phone] 11/05/2020, 8:12 AM

## 2020-11-05 NOTE — Anesthesia Preprocedure Evaluation (Addendum)
Anesthesia Evaluation  Patient identified by MRN, date of birth, ID band Patient awake    Reviewed: Allergy & Precautions, NPO status , Patient's Chart, lab work & pertinent test results  Airway Mallampati: II  TM Distance: >3 FB Neck ROM: Full    Dental  (+) Edentulous Upper, Edentulous Lower   Pulmonary neg pulmonary ROS,    Pulmonary exam normal        Cardiovascular hypertension, Pt. on medications  Rhythm:Regular Rate:Normal     Neuro/Psych negative neurological ROS  negative psych ROS   GI/Hepatic Transaminitis, dilated CBD   Endo/Other  negative endocrine ROS  Renal/GU negative Renal ROS  negative genitourinary   Musculoskeletal negative musculoskeletal ROS (+)   Abdominal (+)  Abdomen: soft. Bowel sounds: normal.  Peds  Hematology negative hematology ROS (+)   Anesthesia Other Findings   Reproductive/Obstetrics                            Anesthesia Physical Anesthesia Plan  ASA: III  Anesthesia Plan: General   Post-op Pain Management:    Induction: Intravenous  PONV Risk Score and Plan: 3 and Ondansetron, Treatment may vary due to age or medical condition and Dexamethasone  Airway Management Planned: Mask and Oral ETT  Additional Equipment: None  Intra-op Plan:   Post-operative Plan: Extubation in OR  Informed Consent: I have reviewed the patients History and Physical, chart, labs and discussed the procedure including the risks, benefits and alternatives for the proposed anesthesia with the patient or authorized representative who has indicated his/her understanding and acceptance.     Dental advisory given  Plan Discussed with: CRNA  Anesthesia Plan Comments: (Lab Results      Component                Value               Date                      WBC                      17.4 (H)            11/05/2020                HGB                      11.2 (L)             11/05/2020                HCT                      32.9 (L)            11/05/2020                MCV                      94.0                11/05/2020                PLT                      109 (L)             11/05/2020  Lab Results      Component                Value               Date                      NA                       139                 11/05/2020                K                        3.4 (L)             11/05/2020                CO2                      21 (L)              11/05/2020                GLUCOSE                  133 (H)             11/05/2020                BUN                      18                  11/05/2020                CREATININE               0.77                11/05/2020                CALCIUM                  8.2 (L)             11/05/2020                GFRNONAA                 >60                 11/05/2020          )       Anesthesia Quick Evaluation

## 2020-11-05 NOTE — Consult Note (Signed)
Referring Provider: Dr. Mitzi Hansen Primary Care Physician:  Lucianne Lei, MD Primary Gastroenterologist:  None  Reason for Consultation:  AB pain, nausea and vomiting  HPI: Paula Bentley is a 80 y.o. female with history of  hypertension, s/p cholecystectomy,  PE on Xarelto being consulted for sepsis in setting of AB pain with common bile duct diltation and obstruction with penumophilia and lesion of left liver.   Patient has sepsis on admission, off xarelto and on heparin drip.  AST 125, ALT 148, alk phos 79, total bilirubin 3.5 which is down from admission a day ago AST 300, ALT 224, alk phos 128 and total bilirubin 7.0.  WBC 17.4, H/H 11.2 and 32.9, platelets 109.  Potassium 3.4, CO2 21,  Comes in with history of 3 days of nausea and vomiting, patient denies abdominal pain or back pain.   Has had chills without known fever.  She has a 10 pound weight loss in the last month. Not had an episode like this in the past. Does not have a GI doctor, had colonoscopy long time ago in [place] where the patient's from. Denies family history of GI malignancies such as liver or pancreatic cancer. Denies alcohol use. Patient denies chest pain, shortness of breath, leg edema, jaundice. Last bowel movement 3 days ago, this appears not to be uncommon for patient has bouts of constipation, denies melena or blood in the stool.  MRCP 11/05/2020 IMPRESSION: 1. Left greater than right intrahepatic biliary duct dilatation with pneumobilia. The common bile duct is dilated up to 14 mm diameter and there may be a punctate filling defect in the mid common bile duct. Abrupt cut off of the common bile duct in the head of the pancreas just proximal to the ampulla without obstructive etiology evident by MRI. 2. 4.4 x 2.4 cm bilobed lesion in the posterior left liver (segment II). Imaging features are nonspecific. Primary hepatic neoplasm or metastatic disease is a concern. 3. Periportal edema with  edema in in the hepatoduodenal ligament. Trace fluid adjacent to the liver. 4. Patency of the left portal vein was better demonstrated on CT yesterday and appears to be patent on today's study. 5. Small to moderate hiatal hernia. 6. Colonic diverticulosis.  CT AB and pelvis 10/2020 1. Biliary ductal dilatation out of proportion for what is expected for an 80 year old patient with a previous cholecystectomy with a possible noncalcified distal common duct obstructing stone. Further evaluation with MRCP is recommended. 2. Ill-defined low density in the lateral segment of the left lobe of the liver where there is more intrahepatic biliary ductal dilatation as well as a 5.7 x 3.6 x 2.3 cm more discrete area of heterogeneous low density in the lateral segment of the left lobe of the liver. This could represent hepatic infection or malignancy. This could also be further evaluated at the time of MRCP. 3. The biliary air could be due to biliary infection, recent stone passage or previous sphincterotomy.  Past Medical History:  Diagnosis Date  . Glaucoma     Past Surgical History:  Procedure Laterality Date  . ABDOMINAL HYSTERECTOMY    . CHOLECYSTECTOMY      Prior to Admission medications   Medication Sig Start Date End Date Taking? Authorizing Provider  acetaminophen (TYLENOL) 325 MG tablet Take 650 mg by mouth every 6 (six) hours as needed for mild pain, fever or headache.   Yes [provider]  acetaminophen (TYLENOL) 650 MG CR tablet Take 650 mg by mouth every 8 (eight)  hours as needed for pain.   Yes [provider]  dorzolamide-timolol (COSOPT) 22.3-6.8 MG/ML ophthalmic solution Place 1 drop into both eyes 2 (two) times daily. 09/12/20  Yes [provider]  latanoprost (XALATAN) 0.005 % ophthalmic solution Place 1 drop into both eyes at bedtime. 11/01/20  Yes [provider]  losartan (COZAAR) 25 MG tablet Take 25 mg by mouth daily. 09/29/20  Yes  [provider]  trolamine salicylate (ASPERCREME) 10 % cream Apply 1 application topically as needed for muscle pain.   Yes [provider]  XARELTO 20 MG TABS tablet Take 20 mg by mouth daily. 10/19/20  Yes [provider]    Scheduled Meds: . dorzolamide-timolol  1 drop Both Eyes BID  . latanoprost  1 drop Both Eyes QHS   Continuous Infusions: . ceFEPime (MAXIPIME) IV 2 g (11/05/20 0507)  . heparin 900 Units/hr (11/05/20 0711)  . metronidazole Stopped (11/05/20 0438)  . potassium chloride     PRN Meds:.acetaminophen **OR** acetaminophen, fentaNYL (SUBLIMAZE) injection, Muscle Rub, ondansetron **OR** ondansetron (ZOFRAN) IV  Allergies as of 11/04/2020  . (No Known Allergies)    Family History  Problem Relation Age of Onset  . Diabetes Neg Hx     Social History   Socioeconomic History  . Marital status: Widowed    Spouse name: Not on file  . Number of children: Not on file  . Years of education: Not on file  . Highest education level: Not on file  Occupational History  . Not on file  Tobacco Use  . Smoking status: Never Smoker  . Smokeless tobacco: Never Used  Vaping Use  . Vaping Use: Never used  Substance and Sexual Activity  . Alcohol use: Never  . Drug use: Never  . Sexual activity: Not on file  Other Topics Concern  . Not on file  Social History Narrative  . Not on file   Social Determinants of Health   Financial Resource Strain: Not on file  Food Insecurity: Not on file  Transportation Needs: Not on file  Physical Activity: Not on file  Stress: Not on file  Social Connections: Not on file  Intimate Partner Violence: Not on file    Review of Systems:  Review of Systems  Constitutional: Positive for chills, malaise/fatigue and weight loss. Negative for diaphoresis and fever.  HENT: Negative for hearing loss.   Eyes: Negative for discharge and redness.  Respiratory: Negative for shortness of breath.   Cardiovascular:  Negative for chest pain and leg swelling.  Gastrointestinal: Positive for constipation, nausea and vomiting. Negative for abdominal pain, blood in stool, diarrhea, heartburn and melena.  Musculoskeletal: Negative for falls.  Skin: Negative for rash.  Neurological: Negative for loss of consciousness.  Psychiatric/Behavioral: Negative for memory loss.     Physical Exam: Vital signs: Vitals:   11/05/20 0729 11/05/20 0903  BP:    Pulse:    Resp:    Temp: 97.8 F (36.6 C) 98.2 F (36.8 C)  SpO2:       Physical Exam Constitutional:      General: She is not in acute distress.    Appearance: She is obese. She is not ill-appearing.     Comments: Appears younger than stated age, patient resting comfortably  Eyes:     Conjunctiva/sclera: Conjunctivae normal.  Cardiovascular:     Rate and Rhythm: Regular rhythm. Bradycardia present.     Heart sounds: No murmur heard.   Pulmonary:     Effort: Pulmonary  effort is normal.     Breath sounds: Normal breath sounds. No wheezing.  Abdominal:     General: Bowel sounds are normal.     Palpations: Abdomen is soft. There is no mass.     Tenderness: There is no abdominal tenderness. There is no rebound.  Musculoskeletal:        General: Normal range of motion.  Skin:    General: Skin is warm.  Neurological:     General: No focal deficit present.     Mental Status: She is alert and oriented to person, place, and time. Mental status is at baseline.  Psychiatric:        Mood and Affect: Mood normal.        Behavior: Behavior normal.     GI:  Lab Results: Recent Labs    11/04/20 1515 11/05/20 0500  WBC 5.1 17.4*  HGB 13.8 11.2*  HCT 40.4 32.9*  PLT 126* 109*   BMET Recent Labs    11/04/20 1515 11/05/20 0500  NA 139 139  K 3.5 3.4*  CL 108 112*  CO2 18* 21*  GLUCOSE 167* 133*  BUN 20 18  CREATININE 0.83 0.77  CALCIUM 9.2 8.2*   LFT Recent Labs    11/05/20 0500  PROT 5.6*  ALBUMIN 2.6*  AST 125*  ALT 148*   ALKPHOS 79  BILITOT 3.5*   PT/INR Recent Labs    11/04/20 1631  LABPROT 20.6*  INR 1.8*    Studies/Results: CT ABDOMEN PELVIS W CONTRAST  Addendum Date: 11/04/2020   ADDENDUM REPORT: 11/04/2020 20:28 ADDENDUM: There is also mild to moderate intrahepatic biliary ductal dilatation the lateral segment of the left lobe of the liver with ill-defined decreased density in that portion of the liver as well as a better defined heterogeneous area of low density measuring 5.7 x 3.6 cm on image number 12/2 and 2.3 cm in length on coronal image number 45/5. There is also a suggestion of a noncalcified distal common duct stone containing nitrogen in the distal common duct on image number 35/2, measuring 8 mm in diameter. Therefore, there should be additional impressions as follows: 1. Biliary ductal dilatation out of proportion for what is expected for an 80 year old patient with a previous cholecystectomy with a possible noncalcified distal common duct obstructing stone. Further evaluation with MRCP is recommended. 2. Ill-defined low density in the lateral segment of the left lobe of the liver where there is more intrahepatic biliary ductal dilatation as well as a 5.7 x 3.6 x 2.3 cm more discrete area of heterogeneous low density in the lateral segment of the left lobe of the liver. This could represent hepatic infection or malignancy. This could also be further evaluated at the time of MRCP. 3. The biliary air could be due to biliary infection, recent stone passage or previous sphincterotomy. Note: The changes in the report have been discussed with Dr. Marnette Burgess. Electronically Signed   By: Claudie Revering M.D.   On: 11/04/2020 20:28   Result Date: 11/04/2020 CLINICAL DATA:  Intermittent diffuse abdominal pain for the past 5 days. Associated chills. Nausea and vomiting for the past 2 days. EXAM: CT ABDOMEN AND PELVIS WITH CONTRAST TECHNIQUE: Multidetector CT imaging of the abdomen and pelvis was performed using the  standard protocol following bolus administration of intravenous contrast. CONTRAST:  171m OMNIPAQUE IOHEXOL 300 MG/ML  SOLN COMPARISON:  None. FINDINGS: Lower chest: Moderate-sized hiatal hernia. Normal sized heart. Mild bibasilar linear atelectasis/scarring. Hepatobiliary: Air in the biliary tree.  Surgically absent gallbladder. Dilated common duct measuring 2.1 cm in maximum diameter. No visible gallstones or mass. Pancreas: Unremarkable. No pancreatic ductal dilatation or surrounding inflammatory changes. Spleen: Normal in size without focal abnormality. Adrenals/Urinary Tract: Adrenal glands are unremarkable. Kidneys are normal, without renal calculi, focal lesion, or hydronephrosis. Bladder is unremarkable. Stomach/Bowel: Moderate-sized hiatal hernia. Extensive colonic diverticulosis without evidence of diverticulitis. Unremarkable small bowel and appendix. Vascular/Lymphatic: No significant vascular findings are present. No enlarged abdominal or pelvic lymph nodes. Reproductive: Status post hysterectomy. No adnexal masses. Other: No abdominal wall hernia or abnormality. No abdominopelvic ascites. Musculoskeletal: Lower lumbar spine degenerative changes. IMPRESSION: 1. No acute abnormality. 2. Moderate-sized hiatal hernia. 3. Extensive colonic diverticulosis. Electronically Signed: By: Claudie Revering M.D. On: 11/04/2020 19:00   MR 3D Recon At Scanner  Result Date: 11/05/2020 CLINICAL DATA:  Jaundice. Nausea vomiting for 3 days. Intermittent upper abdominal pain. EXAM: MRI ABDOMEN WITHOUT AND WITH CONTRAST (INCLUDING MRCP) TECHNIQUE: Multiplanar multisequence MR imaging of the abdomen was performed both before and after the administration of intravenous contrast. Heavily T2-weighted images of the biliary and pancreatic ducts were obtained, and three-dimensional MRCP images were rendered by post processing. CONTRAST:  60m GADAVIST GADOBUTROL 1 MMOL/ML IV SOLN COMPARISON:  Abdomen/pelvis CT 11/04/2020 FINDINGS:  Lower chest: Small to moderate hiatal hernia. Hepatobiliary: Liver measures 17.6 cm craniocaudal length, mildly enlarged. Periportal edema is associated with mild intrahepatic biliary duct dilatation, most prominently in the lateral segment left liver. Left hepatic duct is dilated up to 2 cm diameter with evidence of pneumobilia. Common duct measures 1.4 cm diameter in the porta hepatis with a possible tiny filling defect (axial T2 image 24/4). There is abrupt cut off of the common bile duct in the head of the pancreas just proximal to the ampulla. As noted on CT, a bilobed 4.4 x 2.4 cm lesion is identified in the posterior left liver (segment II). This lesion restricts diffusion shows subtle low level T2 hyperintensity and is similar in signal intensity to liver parenchyma on precontrast imaging. Postcontrast imaging is degraded by proximity to the heart generating substantial motion artifact in this portion of the liver, but the lesion does appear to heterogeneously enhance. Pancreas: No focal mass lesion. No dilatation of the main duct. No intraparenchymal cyst. No peripancreatic edema. Spleen:  No splenomegaly. No focal mass lesion. Adrenals/Urinary Tract: No adrenal nodule or mass. Tiny foci of hypoenhancement in both kidneys are too small to characterize, but likely benign. Stomach/Bowel: Small to moderate hiatal hernia. No small bowel or colonic dilatation within the visualized abdomen. Diverticular disease is seen diffusely in the visualize abdominal segments of the colon. Vascular/Lymphatic: No abdominal aortic aneurysm. Main portal vein and right portal vein are patent. Patency of the left portal vein was better demonstrated on CT yesterday and although attenuated appears to be patent on today's study. There is no gastrohepatic or hepatoduodenal ligament lymphadenopathy. No retroperitoneal or mesenteric lymphadenopathy. Other: Trace perihepatic fluid with edema in Morison's pouch region in the  hepatoduodenal ligament. Musculoskeletal: No focal suspicious marrow enhancement within the visualized bony anatomy. IMPRESSION: 1. Left greater than right intrahepatic biliary duct dilatation with pneumobilia. The common bile duct is dilated up to 14 mm diameter and there may be a punctate filling defect in the mid common bile duct. Abrupt cut off of the common bile duct in the head of the pancreas just proximal to the ampulla without obstructive etiology evident by MRI. 2. 4.4 x 2.4 cm bilobed lesion in the posterior left liver (  segment II). Imaging features are nonspecific. Primary hepatic neoplasm or metastatic disease is a concern. 3. Periportal edema with edema in in the hepatoduodenal ligament. Trace fluid adjacent to the liver. 4. Patency of the left portal vein was better demonstrated on CT yesterday and appears to be patent on today's study. 5. Small to moderate hiatal hernia. 6. Colonic diverticulosis. Electronically Signed   By: Misty Stanley M.D.   On: 11/05/2020 07:56   DG Chest Port 1 View  Result Date: 11/04/2020 CLINICAL DATA:  Questionable sepsis. Patient reports abdominal pain. EXAM: PORTABLE CHEST 1 VIEW COMPARISON:  None. FINDINGS: The cardiomediastinal contours are normal. Streaky atelectasis or scarring at the lung bases. Pulmonary vasculature is normal. No consolidation, pleural effusion, or pneumothorax. No acute osseous abnormalities are seen. IMPRESSION: Streaky atelectasis or scarring at the lung bases. No evidence of pneumonia. Electronically Signed   By: Keith Rake M.D.   On: 11/04/2020 18:57   MR ABDOMEN MRCP W WO CONTAST  Result Date: 11/05/2020 CLINICAL DATA:  Jaundice. Nausea vomiting for 3 days. Intermittent upper abdominal pain. EXAM: MRI ABDOMEN WITHOUT AND WITH CONTRAST (INCLUDING MRCP) TECHNIQUE: Multiplanar multisequence MR imaging of the abdomen was performed both before and after the administration of intravenous contrast. Heavily T2-weighted images of the  biliary and pancreatic ducts were obtained, and three-dimensional MRCP images were rendered by post processing. CONTRAST:  28m GADAVIST GADOBUTROL 1 MMOL/ML IV SOLN COMPARISON:  Abdomen/pelvis CT 11/04/2020 FINDINGS: Lower chest: Small to moderate hiatal hernia. Hepatobiliary: Liver measures 17.6 cm craniocaudal length, mildly enlarged. Periportal edema is associated with mild intrahepatic biliary duct dilatation, most prominently in the lateral segment left liver. Left hepatic duct is dilated up to 2 cm diameter with evidence of pneumobilia. Common duct measures 1.4 cm diameter in the porta hepatis with a possible tiny filling defect (axial T2 image 24/4). There is abrupt cut off of the common bile duct in the head of the pancreas just proximal to the ampulla. As noted on CT, a bilobed 4.4 x 2.4 cm lesion is identified in the posterior left liver (segment II). This lesion restricts diffusion shows subtle low level T2 hyperintensity and is similar in signal intensity to liver parenchyma on precontrast imaging. Postcontrast imaging is degraded by proximity to the heart generating substantial motion artifact in this portion of the liver, but the lesion does appear to heterogeneously enhance. Pancreas: No focal mass lesion. No dilatation of the main duct. No intraparenchymal cyst. No peripancreatic edema. Spleen:  No splenomegaly. No focal mass lesion. Adrenals/Urinary Tract: No adrenal nodule or mass. Tiny foci of hypoenhancement in both kidneys are too small to characterize, but likely benign. Stomach/Bowel: Small to moderate hiatal hernia. No small bowel or colonic dilatation within the visualized abdomen. Diverticular disease is seen diffusely in the visualize abdominal segments of the colon. Vascular/Lymphatic: No abdominal aortic aneurysm. Main portal vein and right portal vein are patent. Patency of the left portal vein was better demonstrated on CT yesterday and although attenuated appears to be patent on  today's study. There is no gastrohepatic or hepatoduodenal ligament lymphadenopathy. No retroperitoneal or mesenteric lymphadenopathy. Other: Trace perihepatic fluid with edema in Morison's pouch region in the hepatoduodenal ligament. Musculoskeletal: No focal suspicious marrow enhancement within the visualized bony anatomy. IMPRESSION: 1. Left greater than right intrahepatic biliary duct dilatation with pneumobilia. The common bile duct is dilated up to 14 mm diameter and there may be a punctate filling defect in the mid common bile duct. Abrupt cut off of the  common bile duct in the head of the pancreas just proximal to the ampulla without obstructive etiology evident by MRI. 2. 4.4 x 2.4 cm bilobed lesion in the posterior left liver (segment II). Imaging features are nonspecific. Primary hepatic neoplasm or metastatic disease is a concern. 3. Periportal edema with edema in in the hepatoduodenal ligament. Trace fluid adjacent to the liver. 4. Patency of the left portal vein was better demonstrated on CT yesterday and appears to be patent on today's study. 5. Small to moderate hiatal hernia. 6. Colonic diverticulosis. Electronically Signed   By: Misty Stanley M.D.   On: 11/05/2020 07:56   US Abdomen Limited RUQ (LIVER/GB)  Result Date: 11/04/2020 CLINICAL DATA:  Jaundice, history of cholecystectomy EXAM: ULTRASOUND ABDOMEN LIMITED RIGHT UPPER QUADRANT COMPARISON:  11/04/2020 CT abdomen/pelvis FINDINGS: Gallbladder: Surgically absent. Common bile duct: Diameter: 20 mm. No stones in the visualized bile ducts, noting nonvisualization of the distal CBD due to overlying bowel gas. Liver: No focal lesion identified. Within normal limits in parenchymal echogenicity. Portal vein is patent on color Doppler imaging with normal direction of blood flow towards the liver. Other: None. IMPRESSION: Prominent extrahepatic biliary ductal dilatation with CBD diameter 20 mm. Suggest MRI abdomen with MRCP without and with IV  contrast for further evaluation to exclude obstructing choledocholithiasis. Electronically Signed   By: Ilona Sorrel M.D.   On: 11/04/2020 20:33    Impression and Plan Cholangitis/choledocholithiasis with elevated liver functions with MRCP showing intrahepatic biliary duct dilatation with pneumoblia, abrupt cut off of common bile duct in the head of the pancreas just proximal to ampul with obstructive etiology. Patient with elevated liver functions and alkaline phosphatase and bilirubin a day ago, liver function and bilirubin decreasing today.  AST 125, ALT 148, alkaline phosphatase 79, total bilirubin 3.5 Possibly patient has passed a stone, however with MRCP very concerning for obstruction schedule ERCP tomrrow. We will schedule for ERCP tomorrow, can do clear liquids at this time, will do NPO at midnight, heparin drip stop 6 hours prior to procedure.  Continue supportive care, serial LFTs Zofran for nausea  Sepsis likely biliary source white blood cell count 7.4, platelets 109, hemoglobin 11.2. 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

## 2020-11-05 NOTE — ED Notes (Signed)
Patient transported to MRI 

## 2020-11-06 ENCOUNTER — Inpatient Hospital Stay (HOSPITAL_COMMUNITY): Payer: Medicare Other | Admitting: Anesthesiology

## 2020-11-06 ENCOUNTER — Encounter (HOSPITAL_COMMUNITY)
Admission: EM | Disposition: A | Discharge status: Home Health | Payer: Self-pay | Source: Home / Self Care | Attending: Internal Medicine

## 2020-11-06 ENCOUNTER — Inpatient Hospital Stay (HOSPITAL_COMMUNITY): Payer: Medicare Other

## 2020-11-06 ENCOUNTER — Encounter (HOSPITAL_COMMUNITY): Payer: Self-pay | Admitting: Family Medicine

## 2020-11-06 HISTORY — PX: REMOVAL OF STONES: SHX5545

## 2020-11-06 HISTORY — PX: ERCP: SHX5425

## 2020-11-06 HISTORY — PX: SPHINCTEROTOMY: SHX5544

## 2020-11-06 LAB — CBC
HCT: 34.5 % — ABNORMAL LOW (ref 36.0–46.0)
Hemoglobin: 11.4 g/dL — ABNORMAL LOW (ref 12.0–15.0)
MCH: 31.7 pg (ref 26.0–34.0)
MCHC: 33 g/dL (ref 30.0–36.0)
MCV: 95.8 fL (ref 80.0–100.0)
Platelets: 114 10*3/uL — ABNORMAL LOW (ref 150–400)
RBC: 3.6 MIL/uL — ABNORMAL LOW (ref 3.87–5.11)
RDW: 15 % (ref 11.5–15.5)
WBC: 11.2 10*3/uL — ABNORMAL HIGH (ref 4.0–10.5)
nRBC: 0 % (ref 0.0–0.2)

## 2020-11-06 LAB — COMPREHENSIVE METABOLIC PANEL
ALT: 103 U/L — ABNORMAL HIGH (ref 0–44)
ALT: 95 U/L — ABNORMAL HIGH (ref 0–44)
AST: 55 U/L — ABNORMAL HIGH (ref 15–41)
AST: 45 U/L — ABNORMAL HIGH (ref 15–41)
Albumin: 2.7 g/dL — ABNORMAL LOW (ref 3.5–5.0)
Albumin: 2.9 g/dL — ABNORMAL LOW (ref 3.5–5.0)
Alkaline Phosphatase: 71 U/L (ref 38–126)
Alkaline Phosphatase: 81 U/L (ref 38–126)
Anion gap: 5 (ref 5–15)
Anion gap: 10 (ref 5–15)
BUN: 16 mg/dL (ref 8–23)
BUN: 19 mg/dL (ref 8–23)
CO2: 18 mmol/L — ABNORMAL LOW (ref 22–32)
CO2: 16 mmol/L — ABNORMAL LOW (ref 22–32)
Calcium: 8.2 mg/dL — ABNORMAL LOW (ref 8.9–10.3)
Calcium: 8.8 mg/dL — ABNORMAL LOW (ref 8.9–10.3)
Chloride: 115 mmol/L — ABNORMAL HIGH (ref 98–111)
Chloride: 111 mmol/L (ref 98–111)
Creatinine, Ser: 0.61 mg/dL (ref 0.44–1.00)
Creatinine, Ser: 0.7 mg/dL (ref 0.44–1.00)
GFR, Estimated: >60 mL/min (ref >60)
GFR, Estimated: >60 mL/min (ref >60)
Glucose, Bld: 78 mg/dL (ref 70–99)
Glucose, Bld: 140 mg/dL — ABNORMAL HIGH (ref 70–99)
Potassium: 4 mmol/L (ref 3.5–5.1)
Potassium: 4.8 mmol/L (ref 3.5–5.1)
Sodium: 138 mmol/L (ref 135–145)
Sodium: 137 mmol/L (ref 135–145)
Total Bilirubin: 1.7 mg/dL — ABNORMAL HIGH (ref 0.3–1.2)
Total Bilirubin: 1.5 mg/dL — ABNORMAL HIGH (ref 0.3–1.2)
Total Protein: 5.7 g/dL — ABNORMAL LOW (ref 6.5–8.1)
Total Protein: 6.3 g/dL — ABNORMAL LOW (ref 6.5–8.1)

## 2020-11-06 LAB — URINE CULTURE: Culture: NO GROWTH

## 2020-11-06 LAB — MAGNESIUM: Magnesium: 1.9 mg/dL (ref 1.7–2.4)

## 2020-11-06 LAB — LACTIC ACID, PLASMA: Lactic Acid, Venous: 2.2 mmol/L (ref 0.5–1.9)

## 2020-11-06 LAB — TSH: TSH: 0.782 u[IU]/mL (ref 0.350–4.500)

## 2020-11-06 LAB — BRAIN NATRIURETIC PEPTIDE: B Natriuretic Peptide: 642.4 pg/mL — ABNORMAL HIGH (ref 0.0–100.0)

## 2020-11-06 LAB — AFP TUMOR MARKER: AFP, Serum, Tumor Marker: 2 ng/mL (ref 0.0–9.2)

## 2020-11-06 IMAGING — RF DG ERCP WO/W SPHINCTEROTOMY
1 series · 6 of 6 positions shown · non-contrast
Comparison: MR [DATE]

CLINICAL DATA: 80-year-old female with a history of dilated biliary
ducts

EXAM:
ERCP
TECHNIQUE: Multiple spot images obtained with the fluoroscopic device and
submitted for interpretation post-procedure.
FLUOROSCOPY TIME:  Fluoroscopy Time:  5 minutes 44 second

[Series 1: run · 6 of 6 slices shown]
[im 1/6]
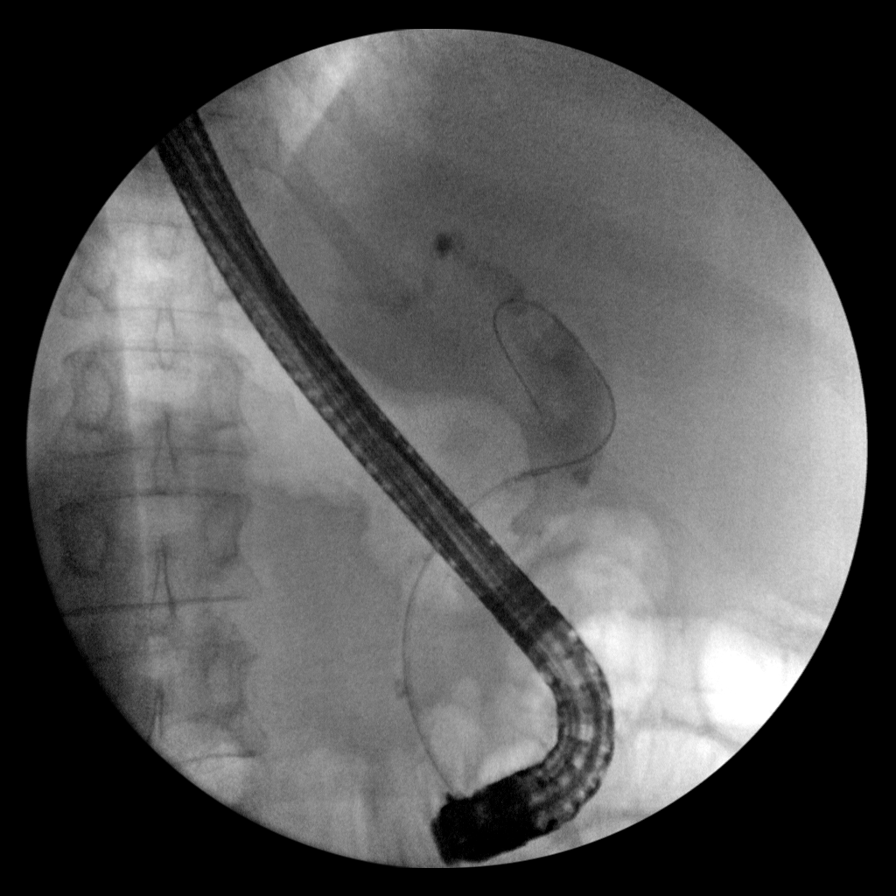
[im 2/6]
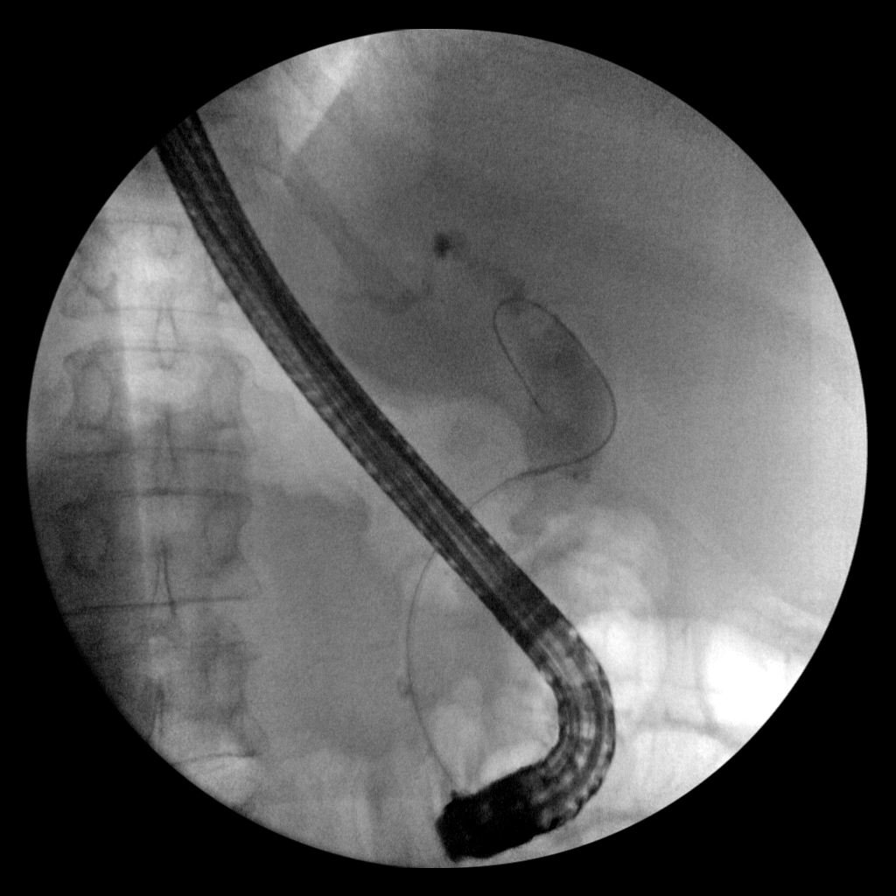
[im 3/6]
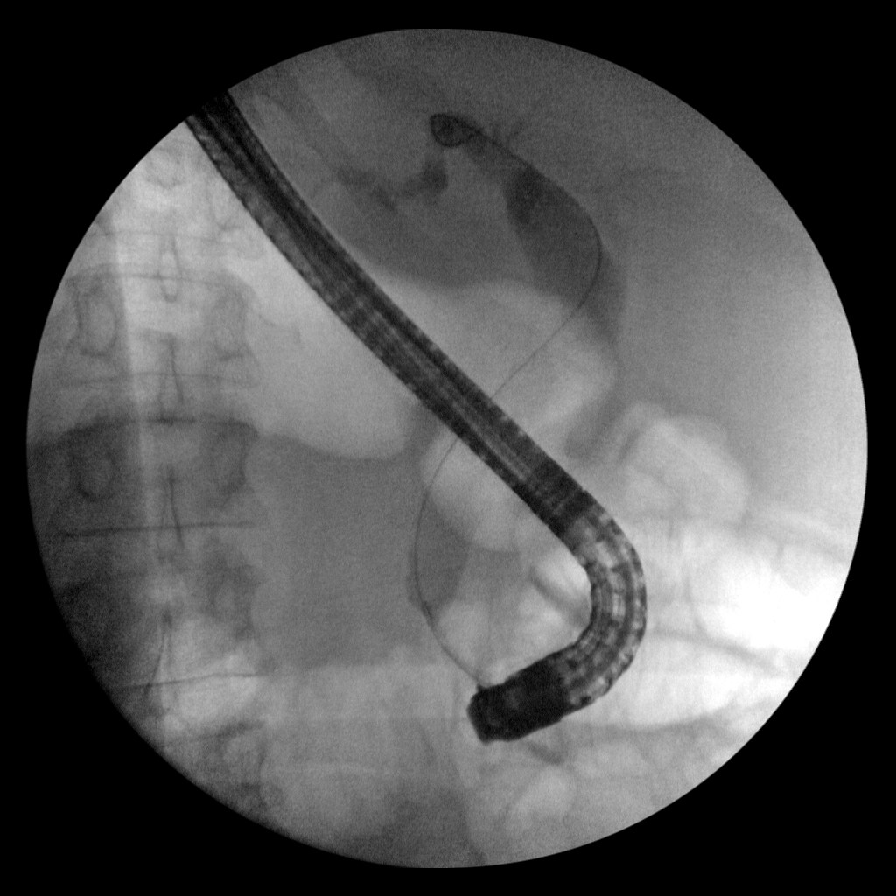
[im 4/6]
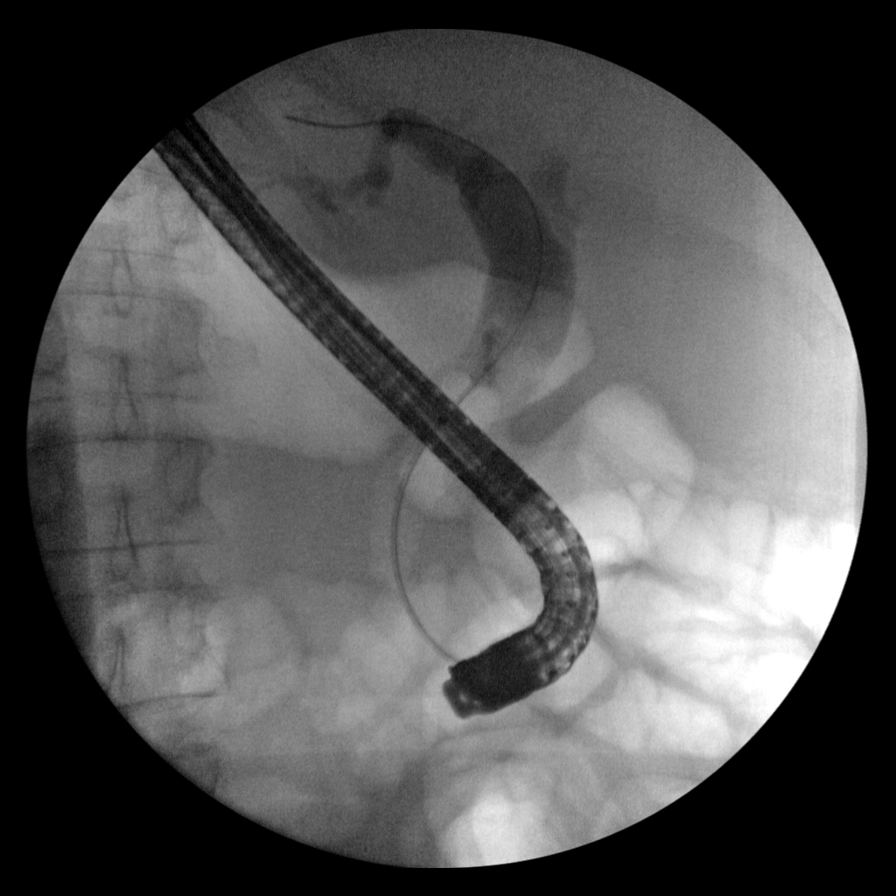
[im 5/6]
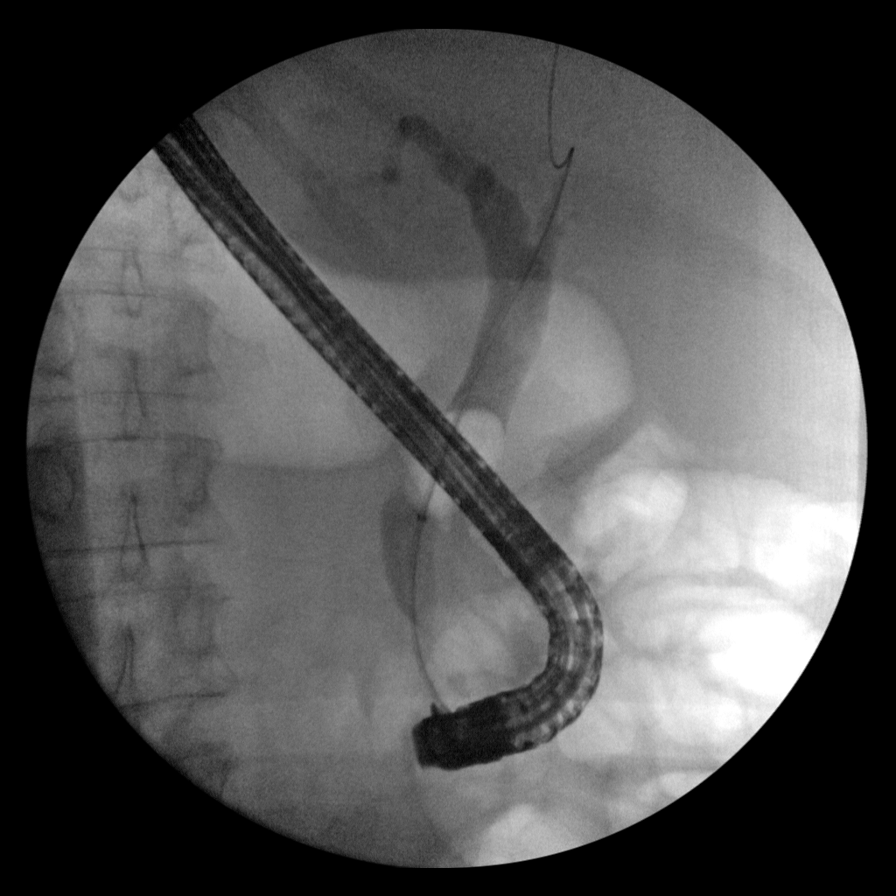
[im 6/6]
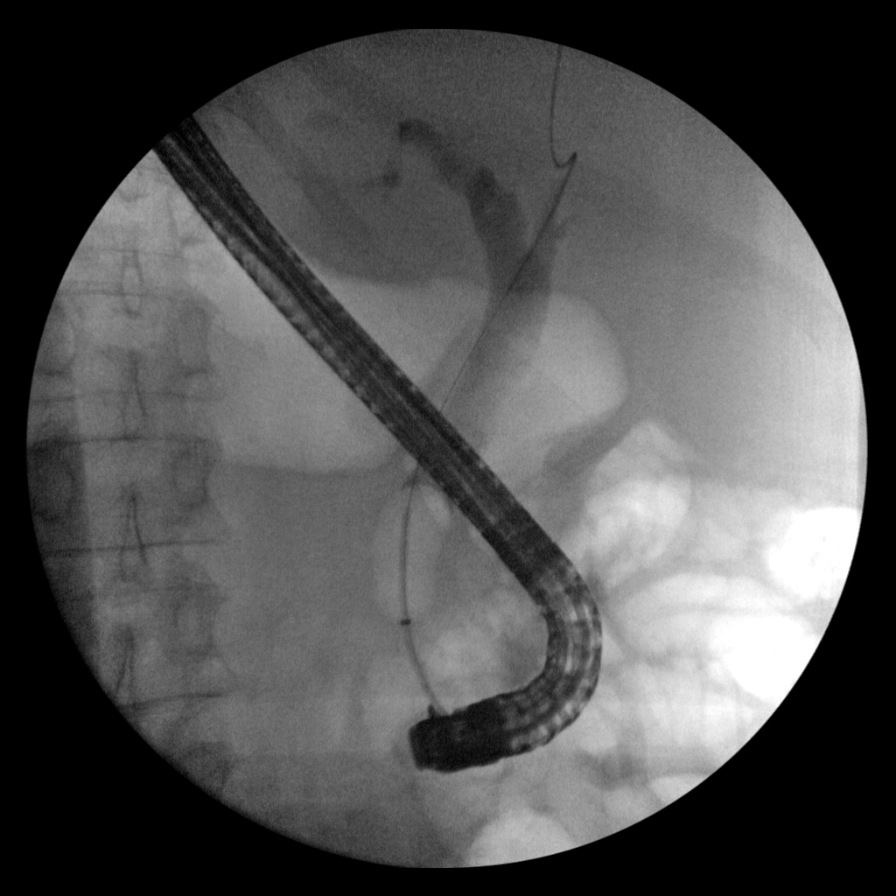

[6 of 6 positions shown; findings below may reference images not displayed]

FINDINGS: Limited intraoperative fluoroscopic spot images during ERCP.

Initial image demonstrates endoscope projecting over the upper
abdomen with a safety wire position and partial opacification of the
extrahepatic biliary ducts.

Subsequently there is passage of a balloon retrieval catheter.
IMPRESSION: Limited images during ERCP demonstrates deployment of balloon
retrieval catheter. Please refer to the dictated operative report
for full details of intraoperative findings and procedure.

## 2020-11-06 SURGERY — ERCP, WITH INTERVENTION IF INDICATED
Anesthesia: General

## 2020-11-06 MED ORDER — HYDRALAZINE HCL 20 MG/ML IJ SOLN
10.0000 mg | Freq: Three times a day (TID) | INTRAMUSCULAR | Status: DC | PRN
  Administered 2020-11-06: 10 mg via INTRAVENOUS
  Filled 2020-11-06: qty 1

## 2020-11-06 MED ORDER — SUCCINYLCHOLINE CHLORIDE 200 MG/10ML IV SOSY
PREFILLED_SYRINGE | INTRAVENOUS | Status: DC | PRN
  Administered 2020-11-06: 100 mg via INTRAVENOUS

## 2020-11-06 MED ORDER — ONDANSETRON HCL 4 MG/2ML IJ SOLN
INTRAMUSCULAR | Status: DC | PRN
  Administered 2020-11-06: 4 mg via INTRAVENOUS

## 2020-11-06 MED ORDER — INDOMETHACIN 50 MG RE SUPP
RECTAL | Status: DC | PRN
Start: 2020-11-06 — End: 2020-11-06
  Administered 2020-11-06: 100 mg via RECTAL

## 2020-11-06 MED ORDER — DEXAMETHASONE SODIUM PHOSPHATE 10 MG/ML IJ SOLN
INTRAMUSCULAR | Status: DC | PRN
  Administered 2020-11-06: 10 mg via INTRAVENOUS

## 2020-11-06 MED ORDER — GLUCAGON HCL RDNA (DIAGNOSTIC) 1 MG IJ SOLR
INTRAMUSCULAR | Status: AC
  Filled 2020-11-06: qty 1

## 2020-11-06 MED ORDER — SODIUM CHLORIDE 0.9 % IV SOLN
INTRAVENOUS | Status: DC | PRN
  Administered 2020-11-06: 50 mL

## 2020-11-06 MED ORDER — INDOMETHACIN 50 MG RE SUPP
RECTAL | Status: AC
  Filled 2020-11-06: qty 2

## 2020-11-06 MED ORDER — LACTATED RINGERS IV SOLN: INTRAVENOUS | Status: DC

## 2020-11-06 MED ORDER — PROPOFOL 10 MG/ML IV BOLUS
INTRAVENOUS | Status: AC
  Filled 2020-11-06: qty 20

## 2020-11-06 MED ORDER — FUROSEMIDE 10 MG/ML IJ SOLN
20.0000 mg | Freq: Once | INTRAMUSCULAR | Status: AC
  Administered 2020-11-06: 20 mg via INTRAVENOUS
  Filled 2020-11-06: qty 2

## 2020-11-06 MED ORDER — FENTANYL CITRATE (PF) 100 MCG/2ML IJ SOLN
INTRAMUSCULAR | Status: AC
  Filled 2020-11-06: qty 2

## 2020-11-06 MED ORDER — FENTANYL CITRATE (PF) 100 MCG/2ML IJ SOLN
INTRAMUSCULAR | Status: DC | PRN
  Administered 2020-11-06 (×2): 50 ug via INTRAVENOUS

## 2020-11-06 MED ORDER — ROCURONIUM BROMIDE 10 MG/ML (PF) SYRINGE
PREFILLED_SYRINGE | INTRAVENOUS | Status: DC | PRN
  Administered 2020-11-06: 40 mg via INTRAVENOUS

## 2020-11-06 MED ORDER — LACTATED RINGERS IV SOLN: INTRAVENOUS | Status: DC | PRN

## 2020-11-06 MED ORDER — EPHEDRINE SULFATE-NACL 50-0.9 MG/10ML-% IV SOSY
PREFILLED_SYRINGE | INTRAVENOUS | Status: DC | PRN
  Administered 2020-11-06: 10 mg via INTRAVENOUS

## 2020-11-06 MED ORDER — LOSARTAN POTASSIUM 50 MG PO TABS
25.0000 mg | ORAL_TABLET | Freq: Every day | ORAL | Status: DC
  Administered 2020-11-06 – 2020-11-10 (×5): 25 mg via ORAL
  Filled 2020-11-06 (×5): qty 1

## 2020-11-06 MED ORDER — LIDOCAINE 2% (20 MG/ML) 5 ML SYRINGE
INTRAMUSCULAR | Status: DC | PRN
  Administered 2020-11-06: 60 mg via INTRAVENOUS

## 2020-11-06 MED ORDER — PHENYLEPHRINE HCL-NACL 10-0.9 MG/250ML-% IV SOLN
INTRAVENOUS | Status: DC | PRN
  Administered 2020-11-06: 25 ug/min via INTRAVENOUS

## 2020-11-06 MED ORDER — PROPOFOL 10 MG/ML IV BOLUS
INTRAVENOUS | Status: DC | PRN
  Administered 2020-11-06: 140 mg via INTRAVENOUS

## 2020-11-06 NOTE — Transfer of Care (Signed)
Immediate Anesthesia Transfer of Care Note  Patient: Paula Bentley  Procedure(s) Performed: ENDOSCOPIC RETROGRADE CHOLANGIOPANCREATOGRAPHY (ERCP) SPHINCTEROTOMY REMOVAL OF STONES  Patient Location: PACU and Endoscopy Unit  Anesthesia Type:General  Level of Consciousness: awake and alert   Airway & Oxygen Therapy: Patient Spontanous Breathing and Patient connected to face mask oxygen  Post-op Assessment: Report given to RN and Post -op Vital signs reviewed and stable  Post vital signs: Reviewed and stable  Last Vitals:  Vitals Value Taken Time  BP    Temp    Pulse    Resp    SpO2      Last Pain:  Vitals:   11/06/20 0708  TempSrc: Oral  PainSc: 0-No pain         Complications: No notable events documented.

## 2020-11-06 NOTE — Op Note (Signed)
[facility] Patient Name: Paula Bentley Procedure Date: 11/06/2020 MRN: [id number] Attending MD: Clarene Essex , MD Date of Birth: December 31, 1940 CSN: [id number] Age: 80 Admit Type: Inpatient Procedure:                ERCP Indications:              Abnormal MRCP, Suspected ascending cholangitis,                            Elevated liver enzymes Providers:                Clarene Essex, MD, Clyde Lundborg, RN, Elspeth Cho                            Tech., Technician, Glenis Smoker, CRNA Referring MD:              Medicines:                General Anesthesia Complications:            No immediate complications. Estimated Blood Loss:     Estimated blood loss: none. Procedure:                Pre-Anesthesia Assessment:                           - Prior to the procedure, a History and Physical                            was performed, and patient medications and                            allergies were reviewed. The patient's tolerance of                            previous anesthesia was also reviewed. The risks                            and benefits of the procedure and the sedation                            options and risks were discussed with the patient.                            All questions were answered, and informed consent                            was obtained. Prior Anticoagulants: The patient has                            taken Xarelto (rivaroxaban), last dose was 2 days                            prior to procedure. ASA Grade Assessment: II - A  patient with mild systemic disease. After reviewing                            the risks and benefits, the patient was deemed in                            satisfactory condition to undergo the procedure.                           After obtaining informed consent, the scope was                            passed under direct vision. Throughout the                             procedure, the patient's blood pressure, pulse, and                            oxygen saturations were monitored continuously. The                            ERCP [id number] was introduced through the mouth, and                            used to inject contrast into and used to cannulate                            the bile duct. The ERCP was accomplished without                            difficulty. The patient tolerated the procedure                            well. 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 A biliary       sphincterotomy had been performed. Deep selective cannulation was       readily obtained and the sphincterotomy appeared open and obvious stones       were seen on initial limited cholangiogram. The biliary sphincterotomy       was extended with a Hydratome sphincterotome using ERBE electrocautery       in the customary fashion until we had adequate biliary drainage and       could get the fully bowed sphincterotome easily in and out of the duct.       There was no post-sphincterotomy bleeding. To discover objects, the       biliary tree was swept initially with an adjustable 12 to 15 mm balloon       and we only use the 15 mm balloon and lots of stones and sludge were       removed and we readjusted the wire and swept the right system as well       and again more stones and sludge was removed and based on the duct       dilation we then switched to the 18 mm  balloon starting at the upper       third of the main bile duct and bifurcation and a little more sludge was       removed but multiple negative balloon pull-through's were then done and       on occlusion cholangiogram at the end of the procedure no obvious       residual abnormalities were seen, and mentioned above both the left main       hepatic duct and right main hepatic duct were also swept with the       smaller balloon as above. Sludge was swept from the duct.  All stones       were removed. Nothing was found at the end of the procedure. 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                           - Prior biliary sphincterotomy appeared open.                           - Choledocholithiasis was found. Complete removal                            was accomplished by biliary sphincterotomy and                            balloon extraction.                           - A biliary sphincterotomy was performed.                           - The biliary tree was swept and nothing was found                            at the end of the procedure. Moderate Sedation:      Not Applicable - Patient had care per Anesthesia. Recommendation:           - Clear liquid diet for 6 hours.                           - Continue present medications. Can resume blood                            thinners if truly needed in a day or 2 unless                            radiology plans to proceed with biopsy which they                            could do tomorrow if no delayed complications and                            she is  doing well from today's procedure                           - Return to GI clinic PRN.                           - Telephone GI clinic if symptomatic PRN.                           - Refer to an interventional radiologist for liver                            biopsy to be done tomorrow if no delayed                            complications today. Procedure Code(s):        --- Professional ---                           [phone], Endoscopic retrograde                            cholangiopancreatography (ERCP); with removal of                            calculi/debris from biliary/pancreatic duct(s)                           43262, Endoscopic retrograde                            cholangiopancreatography (ERCP); with                             sphincterotomy/papillotomy Diagnosis Code(s):        --- Professional ---                           K80.50, Calculus of bile duct without cholangitis                            or cholecystitis without obstruction                           R74.8, Abnormal levels of other serum enzymes                           R93.2, Abnormal findings on diagnostic imaging of                            liver and biliary tract CPT copyright 2019 [employer]. All rights reserved. The codes documented in this report are preliminary and upon coder review may  be revised to meet current compliance requirements. Clarene Essex, MD 11/06/2020 9:02:54 AM This report has been signed electronically. Number of Addenda: 0

## 2020-11-06 NOTE — Anesthesia Procedure Notes (Signed)
Date/Time: 11/06/2020 8:50 AM Performed by: Cynda Familia, CRNA Oxygen Delivery Method: Simple face mask Placement Confirmation: breath sounds checked- equal and bilateral and positive ETCO2 Dental Injury: Teeth and Oropharynx as per pre-operative assessment

## 2020-11-06 NOTE — Anesthesia Procedure Notes (Signed)
Procedure Name: Intubation Date/Time: 11/06/2020 7:54 AM Performed by: Cynda Familia, CRNA Pre-anesthesia Checklist: Patient identified, Emergency Drugs available, Suction available and Patient being monitored Patient Re-evaluated:Patient Re-evaluated prior to induction Oxygen Delivery Method: Circle System Utilized Preoxygenation: Pre-oxygenation with 100% oxygen Induction Type: IV induction, Rapid sequence and Cricoid Pressure applied Ventilation: Mask ventilation without difficulty Laryngoscope Size: Miller and 1 Grade View: Grade I Tube type: Oral Number of attempts: 1 Airway Equipment and Method: Stylet Placement Confirmation: ETT inserted through vocal cords under direct vision, positive ETCO2 and breath sounds checked- equal and bilateral Secured at: 22 cm Tube secured with: Tape Dental Injury: Teeth and Oropharynx as per pre-operative assessment  Comments: Smooth IV induction Stoltzfus-- intubation atraumatic--- mouth as preop- no teeth- bilat BS

## 2020-11-06 NOTE — Progress Notes (Signed)
I discussed the patient's liver lesion with Dr. Pascal Lux of interventional radiology and based on the CT and MRI and confirming with  another radiologist they think possibly this abnormality could be a hemangioma and recommend a repeat MRI in 1 month to possibly get better pictures of it when she is not sick and can lie still and hopefully they will be less artifact and then they can decide whether biopsy is needed or not

## 2020-11-06 NOTE — Progress Notes (Addendum)
PROGRESS NOTE    Paula Bentley  YQM:[id number] DOB: 1941-03-13 DOA: 11/04/2020 PCP: Lucianne Lei, MD   Brief Narrative:   Patient is 80 year old female with history of hypertension, glaucoma, PE on Xarelto who presented to the emergency room with complaints of nausea, vomiting, chills, abdominal pain.  She developed the symptoms 3 days ago and was unable to tolerate any food or drink.  Pain was mostly across the right upper quadrant.  She has history of cholecystectomy  20 years ago.  On arrival she was febrile with fever of 40 C, hypotensive. chest x-ray did not show pneumonia.  Lab work showed elevated liver enzymes along with T bili.  Lipase was normal.  CT abdomen/pelvis showed air in the biliary tree,dilated CBD .  Patient was admitted for septic shock secondary to biliary source.  MRCP showed pneumobilia, dilated CBD. She is now s/p ERCP w/ [facility].  Assessment & Plan: Septic shock     - Presented with hypotension, high-grade fever, chills.     - Started on broad-spectrum antibiotics, IV fluids.     - Sepsis is thought to be secondary to biliary source.     - stones removed during ERCP; continuing cefepime/flagyl   Cholangitis/possible choledocholithiasis     - Elevated liver enzymes with T bili on presentation.     - MRCP showed left greater than right intrahepatic biliary duct dilatation with pneumobilia. CBD of 14 mm diameter and possible punctate filling defect in the mid common bile Duct.     [name] consulted, now s/p ERCP     - stones removed during ERCP; continuing cefepime/flagyl   Liver lesion     - MRI also showed 4.4 x 2.4 cm bilobed lesion in the posterior left liver, features are nonspecific.      - Primary hepatic neoplasm or metastatic disease cannot be ruled out     - alpha-fetoprotein ordered     - IR consulted for Bx   History of PE     - On Xarelto at home.       - started on heparin drip by the admitting physician, currently on hold due to  thrombocytopenia   History of hypertension:      - Hypotensive on presentation; BP meds held     - BP is up, can resume anti-hypertensives   Hypokalemia     - K+ replaced, follow; check Mg2+   Thrombocytopenia     - Most likely associated  with sepsis     - xarelto, heparin gtt held     - plts 114 this AM, no evidence of bleed, continue to trend   Glaucoma     - Continue home eyedrops.  Bradycardia     - she's been bradycardic the last day     - intake EKG showed NSR; let's rpt an EKG     - rpt EKG showed jxnal rhythms, spoke with cardiology (Dr. Terri Skains); getting another EKG, rpt metabolic profile, check TSH, BNP     - BNP is up. Will give her a OT dose of lasix (spoke with cards about this)     - let's mobilize her around the room, HR seems to be coming up an rpt EKG shows more sinus brady  DVT prophylaxis: SCDs Code Status: FULL Family Communication: spoke w/ sister by phone   Status is: Inpatient  Remains inpatient appropriate because:Inpatient level of care appropriate due to severity of illness  Dispo: The patient is from: Home  Anticipated d/c is to: Home              Patient currently is not medically stable to d/c.   Difficult to place patient No  Consultants:  GI IR  Procedures:  ERCP  Antimicrobials:  Cefepime, flagyl   Subjective: She is groggy after her procedures  Objective: Vitals:   11/06/20 0400 11/06/20 0500 11/06/20 0600 11/06/20 0708  BP: (!) 128/52 (!) 132/59 (!) 144/88 (!) 167/83  Pulse: 81 (!) 44 (!) 50 (!) 47  Resp: (!) 25 (!) 25 (!) 39 (!) 23  Temp: 98.3 F (36.8 C)   98 F (36.7 C)  TempSrc: Oral   Oral  SpO2: 96% 93% 97% 98%  Weight:  81.2 kg  81.2 kg  Height:    5\' 3"  (1.6 m)    Intake/Output Summary (Last 24 hours) at 11/06/2020 0813 Last data filed at 11/06/2020 0973 Gross per 24 hour  Intake 1369.71 ml  Output --  Net 1369.71 ml   Filed Weights   11/04/20 1414 11/06/20 0500 11/06/20 0708  Weight: 54.4 kg  81.2 kg 81.2 kg    Examination:  General: 80 y.o. female resting in bed in NAD Eyes: PERRL, normal sclera ENMT: Nares patent w/o discharge, orophaynx clear, dentition normal, ears w/o discharge/lesions/ulcers Neck: Supple, trachea midline Cardiovascular: brady, +S1, S2, no m/g/r, equal pulses throughout Respiratory: CTABL, no w/r/r, normal WOB GI: BS+, NDNT, no masses noted, no organomegaly noted MSK: No e/c/c Neuro: groggy but follows commands   Data Reviewed: I have personally reviewed following labs and imaging studies.  CBC: Recent Labs  Lab 11/04/20 1515 11/05/20 0500 11/06/20 0257  WBC 5.1 17.4* 11.2*  NEUTROABS 4.9  --   --   HGB 13.8 11.2* 11.4*  HCT 40.4 32.9* 34.5*  MCV 92.7 94.0 95.8  PLT 126* 109* 532*   Basic Metabolic Panel: Recent Labs  Lab 11/04/20 1515 11/05/20 0500 11/06/20 0257  NA 139 139 138  K 3.5 3.4* 4.0  CL 108 112* 115*  CO2 18* 21* 18*  GLUCOSE 167* 133* 78  BUN 20 18 16   CREATININE 0.83 0.77 0.61  CALCIUM 9.2 8.2* 8.2*   GFR: Estimated Creatinine Clearance: 56.6 mL/min (by C-G formula based on SCr of 0.61 mg/dL). Liver Function Tests: Recent Labs  Lab 11/04/20 1515 11/05/20 0500 11/06/20 0257  AST 300* 125* 55*  ALT 224* 148* 103*  ALKPHOS 128* 79 71  BILITOT 7.0* 3.5* 1.7*  PROT 6.7 5.6* 5.7*  ALBUMIN 3.2* 2.6* 2.7*   Recent Labs  Lab 11/04/20 1515  LIPASE 18   No results for input(s): AMMONIA in the last 168 hours. Coagulation Profile: Recent Labs  Lab 11/04/20 1631  INR 1.8*   Cardiac Enzymes: No results for input(s): CKTOTAL, CKMB, CKMBINDEX, TROPONINI in the last 168 hours. BNP (last 3 results) No results for input(s): PROBNP in the last 8760 hours. HbA1C: No results for input(s): HGBA1C in the last 72 hours. CBG: No results for input(s): GLUCAP in the last 168 hours. Lipid Profile: No results for input(s): CHOL, HDL, LDLCALC, TRIG, CHOLHDL, LDLDIRECT in the last 72 hours. Thyroid Function Tests: No  results for input(s): TSH, T4TOTAL, FREET4, T3FREE, THYROIDAB in the last 72 hours. Anemia Panel: No results for input(s): VITAMINB12, FOLATE, FERRITIN, TIBC, IRON, RETICCTPCT in the last 72 hours. Sepsis Labs: Recent Labs  Lab 11/04/20 1515 11/05/20 0912  PROCALCITON 4.81  --   LATICACIDVEN 1.6 1.6    Recent Results (from the past 240  hour(s))  Resp Panel by RT-PCR (Flu A&B, Covid) Nasopharyngeal Swab     Status: None   Collection Time: 11/04/20  4:31 PM   Specimen: Nasopharyngeal Swab; Nasopharyngeal(NP) swabs in vial transport medium  Result Value Ref Range Status   SARS Coronavirus 2 by RT PCR NEGATIVE NEGATIVE Final    Comment: (NOTE) SARS-CoV-2 target nucleic acids are NOT DETECTED.  The SARS-CoV-2 RNA is generally detectable in upper respiratory specimens during the acute phase of infection. The lowest concentration of SARS-CoV-2 viral copies this assay can detect is 138 copies/mL. A negative result does not preclude SARS-Cov-2 infection and should not be used as the sole basis for treatment or other patient management decisions. A negative result may occur with  improper specimen collection/handling, submission of specimen other than nasopharyngeal swab, presence of viral mutation(s) within the areas targeted by this assay, and inadequate number of viral copies(<138 copies/mL). A negative result must be combined with clinical observations, patient history, and epidemiological information. The expected result is Negative.  Fact Sheet for Patients:  EntrepreneurPulse.com.au  Fact Sheet for Healthcare Providers:  IncredibleEmployment.be  This test is no t yet approved or cleared by the [place] FDA and  has been authorized for detection and/or diagnosis of SARS-CoV-2 by FDA under an Emergency Use Authorization (EUA). This EUA will remain  in effect (meaning this test can be used) for the duration of the COVID-19 declaration  under Section 564(b)(1) of the Act, 21 U.S.C.section 360bbb-3(b)(1), unless the authorization is terminated  or revoked sooner.       Influenza A by PCR NEGATIVE NEGATIVE Final   Influenza B by PCR NEGATIVE NEGATIVE Final    Comment: (NOTE) The Xpert Xpress SARS-CoV-2/FLU/RSV plus assay is intended as an aid in the diagnosis of influenza from Nasopharyngeal swab specimens and should not be used as a sole basis for treatment. Nasal washings and aspirates are unacceptable for Xpert Xpress SARS-CoV-2/FLU/RSV testing.  Fact Sheet for Patients: EntrepreneurPulse.com.au  Fact Sheet for Healthcare Providers: IncredibleEmployment.be  This test is not yet approved or cleared by the [place] FDA and has been authorized for detection and/or diagnosis of SARS-CoV-2 by FDA under an Emergency Use Authorization (EUA). This EUA will remain in effect (meaning this test can be used) for the duration of the COVID-19 declaration under Section 564(b)(1) of the Act, 21 U.S.C. section 360bbb-3(b)(1), unless the authorization is terminated or revoked.  Performed at [facility], [place] [address], [place], [place] [address]   Blood Culture (routine x 2)     Status: None (Preliminary result)   Collection Time: 11/04/20  4:31 PM   Specimen: BLOOD  Result Value Ref Range Status   Specimen Description   Final    BLOOD RIGHT ANTECUBITAL Performed at [facility] [address] [place], [place] [address]    Special Requests   Final    BOTTLES DRAWN AEROBIC AND ANAEROBIC Blood Culture results may not be optimal due to an inadequate volume of blood received in culture bottles Performed at [facility] [address] [place], [place] [address]    Culture   Final    NO GROWTH < 12 HOURS Performed at [facility] [address], [place], [place] [address]    Report Status PENDING  Incomplete  Blood  Culture (routine x 2)     Status: None (Preliminary result)   Collection Time: 11/04/20  4:36 PM   Specimen: BLOOD RIGHT ARM  Result Value  Ref Range Status   Specimen Description   Final    BLOOD RIGHT ARM Performed at [facility], [place] [address], [place], [place] [address]    Special Requests   Final    BOTTLES DRAWN AEROBIC AND ANAEROBIC Blood Culture results may not be optimal due to an inadequate volume of blood received in culture bottles Performed at [facility] [address], [place], [place] [address]    Culture   Final    NO GROWTH < 12 HOURS Performed at [facility] [address] [place], [place] [address]    Report Status PENDING  Incomplete  MRSA PCR Screening     Status: None   Collection Time: 11/05/20  9:00 AM   Specimen: Nasopharyngeal  Result Value Ref Range Status   MRSA by PCR NEGATIVE NEGATIVE Final    Comment:        The GeneXpert MRSA Assay (FDA approved for NASAL specimens only), is one component of a comprehensive MRSA colonization surveillance program. It is not intended to diagnose MRSA infection nor to guide or monitor treatment for MRSA infections. Performed at [facility], [place] [address], [place], [place] [address]       Radiology Studies: CT ABDOMEN PELVIS W CONTRAST  Addendum Date: 11/04/2020   ADDENDUM REPORT: 11/04/2020 20:28 ADDENDUM: There is also mild to moderate intrahepatic biliary ductal dilatation the lateral segment of the left lobe of the liver with ill-defined decreased density in that portion of the liver as well as a better defined heterogeneous area of low density measuring 5.7 x 3.6 cm on image number 12/2 and 2.3 cm in length on coronal image number 45/5. There is also a suggestion of a noncalcified distal common duct stone containing nitrogen in the distal common duct on image number 35/2, measuring 8 mm in diameter. Therefore, there should be additional impressions as  follows: 1. Biliary ductal dilatation out of proportion for what is expected for an 80 year old patient with a previous cholecystectomy with a possible noncalcified distal common duct obstructing stone. Further evaluation with MRCP is recommended. 2. Ill-defined low density in the lateral segment of the left lobe of the liver where there is more intrahepatic biliary ductal dilatation as well as a 5.7 x 3.6 x 2.3 cm more discrete area of heterogeneous low density in the lateral segment of the left lobe of the liver. This could represent hepatic infection or malignancy. This could also be further evaluated at the time of MRCP. 3. The biliary air could be due to biliary infection, recent stone passage or previous sphincterotomy. Note: The changes in the report have been discussed with Dr. Marnette Burgess. Electronically Signed   By: Claudie Revering M.D.   On: 11/04/2020 20:28   Result Date: 11/04/2020 CLINICAL DATA:  Intermittent diffuse abdominal pain for the past 5 days. Associated chills. Nausea and vomiting for the past 2 days. EXAM: CT ABDOMEN AND PELVIS WITH CONTRAST TECHNIQUE: Multidetector CT imaging of the abdomen and pelvis was performed using the standard protocol following bolus administration of intravenous contrast. CONTRAST:  184mL OMNIPAQUE IOHEXOL 300 MG/ML  SOLN COMPARISON:  None. FINDINGS: Lower chest: Moderate-sized hiatal hernia. Normal sized heart. Mild bibasilar linear atelectasis/scarring. Hepatobiliary: Air in the biliary tree. Surgically absent gallbladder. Dilated common duct measuring 2.1 cm in maximum diameter. No visible gallstones or mass. Pancreas: Unremarkable. No pancreatic ductal dilatation or surrounding inflammatory changes. Spleen: Normal in size without focal abnormality. Adrenals/Urinary Tract: Adrenal glands are  unremarkable. Kidneys are normal, without renal calculi, focal lesion, or hydronephrosis. Bladder is unremarkable. Stomach/Bowel: Moderate-sized hiatal hernia. Extensive colonic  diverticulosis without evidence of diverticulitis. Unremarkable small bowel and appendix. Vascular/Lymphatic: No significant vascular findings are present. No enlarged abdominal or pelvic lymph nodes. Reproductive: Status post hysterectomy. No adnexal masses. Other: No abdominal wall hernia or abnormality. No abdominopelvic ascites. Musculoskeletal: Lower lumbar spine degenerative changes. IMPRESSION: 1. No acute abnormality. 2. Moderate-sized hiatal hernia. 3. Extensive colonic diverticulosis. Electronically Signed: By: Claudie Revering M.D. On: 11/04/2020 19:00   MR 3D Recon At Scanner  Result Date: 11/05/2020 CLINICAL DATA:  Jaundice. Nausea vomiting for 3 days. Intermittent upper abdominal pain. EXAM: MRI ABDOMEN WITHOUT AND WITH CONTRAST (INCLUDING MRCP) TECHNIQUE: Multiplanar multisequence MR imaging of the abdomen was performed both before and after the administration of intravenous contrast. Heavily T2-weighted images of the biliary and pancreatic ducts were obtained, and three-dimensional MRCP images were rendered by post processing. CONTRAST:  13mL GADAVIST GADOBUTROL 1 MMOL/ML IV SOLN COMPARISON:  Abdomen/pelvis CT 11/04/2020 FINDINGS: Lower chest: Small to moderate hiatal hernia. Hepatobiliary: Liver measures 17.6 cm craniocaudal length, mildly enlarged. Periportal edema is associated with mild intrahepatic biliary duct dilatation, most prominently in the lateral segment left liver. Left hepatic duct is dilated up to 2 cm diameter with evidence of pneumobilia. Common duct measures 1.4 cm diameter in the porta hepatis with a possible tiny filling defect (axial T2 image 24/4). There is abrupt cut off of the common bile duct in the head of the pancreas just proximal to the ampulla. As noted on CT, a bilobed 4.4 x 2.4 cm lesion is identified in the posterior left liver (segment II). This lesion restricts diffusion shows subtle low level T2 hyperintensity and is similar in signal intensity to liver parenchyma  on precontrast imaging. Postcontrast imaging is degraded by proximity to the heart generating substantial motion artifact in this portion of the liver, but the lesion does appear to heterogeneously enhance. Pancreas: No focal mass lesion. No dilatation of the main duct. No intraparenchymal cyst. No peripancreatic edema. Spleen:  No splenomegaly. No focal mass lesion. Adrenals/Urinary Tract: No adrenal nodule or mass. Tiny foci of hypoenhancement in both kidneys are too small to characterize, but likely benign. Stomach/Bowel: Small to moderate hiatal hernia. No small bowel or colonic dilatation within the visualized abdomen. Diverticular disease is seen diffusely in the visualize abdominal segments of the colon. Vascular/Lymphatic: No abdominal aortic aneurysm. Main portal vein and right portal vein are patent. Patency of the left portal vein was better demonstrated on CT yesterday and although attenuated appears to be patent on today's study. There is no gastrohepatic or hepatoduodenal ligament lymphadenopathy. No retroperitoneal or mesenteric lymphadenopathy. Other: Trace perihepatic fluid with edema in Morison's pouch region in the hepatoduodenal ligament. Musculoskeletal: No focal suspicious marrow enhancement within the visualized bony anatomy. IMPRESSION: 1. Left greater than right intrahepatic biliary duct dilatation with pneumobilia. The common bile duct is dilated up to 14 mm diameter and there may be a punctate filling defect in the mid common bile duct. Abrupt cut off of the common bile duct in the head of the pancreas just proximal to the ampulla without obstructive etiology evident by MRI. 2. 4.4 x 2.4 cm bilobed lesion in the posterior left liver (segment II). Imaging features are nonspecific. Primary hepatic neoplasm or metastatic disease is a concern. 3. Periportal edema with edema in in the hepatoduodenal ligament. Trace fluid adjacent to the liver. 4. Patency of the left portal vein was  better  demonstrated on CT yesterday and appears to be patent on today's study. 5. Small to moderate hiatal hernia. 6. Colonic diverticulosis. Electronically Signed   By: Misty Stanley M.D.   On: 11/05/2020 07:56   DG Chest Port 1 View  Result Date: 11/04/2020 CLINICAL DATA:  Questionable sepsis. Patient reports abdominal pain. EXAM: PORTABLE CHEST 1 VIEW COMPARISON:  None. FINDINGS: The cardiomediastinal contours are normal. Streaky atelectasis or scarring at the lung bases. Pulmonary vasculature is normal. No consolidation, pleural effusion, or pneumothorax. No acute osseous abnormalities are seen. IMPRESSION: Streaky atelectasis or scarring at the lung bases. No evidence of pneumonia. Electronically Signed   By: Keith Rake M.D.   On: 11/04/2020 18:57   MR ABDOMEN MRCP W WO CONTAST  Result Date: 11/05/2020 CLINICAL DATA:  Jaundice. Nausea vomiting for 3 days. Intermittent upper abdominal pain. EXAM: MRI ABDOMEN WITHOUT AND WITH CONTRAST (INCLUDING MRCP) TECHNIQUE: Multiplanar multisequence MR imaging of the abdomen was performed both before and after the administration of intravenous contrast. Heavily T2-weighted images of the biliary and pancreatic ducts were obtained, and three-dimensional MRCP images were rendered by post processing. CONTRAST:  18mL GADAVIST GADOBUTROL 1 MMOL/ML IV SOLN COMPARISON:  Abdomen/pelvis CT 11/04/2020 FINDINGS: Lower chest: Small to moderate hiatal hernia. Hepatobiliary: Liver measures 17.6 cm craniocaudal length, mildly enlarged. Periportal edema is associated with mild intrahepatic biliary duct dilatation, most prominently in the lateral segment left liver. Left hepatic duct is dilated up to 2 cm diameter with evidence of pneumobilia. Common duct measures 1.4 cm diameter in the porta hepatis with a possible tiny filling defect (axial T2 image 24/4). There is abrupt cut off of the common bile duct in the head of the pancreas just proximal to the ampulla. As noted on CT, a bilobed  4.4 x 2.4 cm lesion is identified in the posterior left liver (segment II). This lesion restricts diffusion shows subtle low level T2 hyperintensity and is similar in signal intensity to liver parenchyma on precontrast imaging. Postcontrast imaging is degraded by proximity to the heart generating substantial motion artifact in this portion of the liver, but the lesion does appear to heterogeneously enhance. Pancreas: No focal mass lesion. No dilatation of the main duct. No intraparenchymal cyst. No peripancreatic edema. Spleen:  No splenomegaly. No focal mass lesion. Adrenals/Urinary Tract: No adrenal nodule or mass. Tiny foci of hypoenhancement in both kidneys are too small to characterize, but likely benign. Stomach/Bowel: Small to moderate hiatal hernia. No small bowel or colonic dilatation within the visualized abdomen. Diverticular disease is seen diffusely in the visualize abdominal segments of the colon. Vascular/Lymphatic: No abdominal aortic aneurysm. Main portal vein and right portal vein are patent. Patency of the left portal vein was better demonstrated on CT yesterday and although attenuated appears to be patent on today's study. There is no gastrohepatic or hepatoduodenal ligament lymphadenopathy. No retroperitoneal or mesenteric lymphadenopathy. Other: Trace perihepatic fluid with edema in Morison's pouch region in the hepatoduodenal ligament. Musculoskeletal: No focal suspicious marrow enhancement within the visualized bony anatomy. IMPRESSION: 1. Left greater than right intrahepatic biliary duct dilatation with pneumobilia. The common bile duct is dilated up to 14 mm diameter and there may be a punctate filling defect in the mid common bile duct. Abrupt cut off of the common bile duct in the head of the pancreas just proximal to the ampulla without obstructive etiology evident by MRI. 2. 4.4 x 2.4 cm bilobed lesion in the posterior left liver (segment II). Imaging features are nonspecific. Primary  hepatic neoplasm or metastatic disease is a concern. 3. Periportal edema with edema in in the hepatoduodenal ligament. Trace fluid adjacent to the liver. 4. Patency of the left portal vein was better demonstrated on CT yesterday and appears to be patent on today's study. 5. Small to moderate hiatal hernia. 6. Colonic diverticulosis. Electronically Signed   By: Misty Stanley M.D.   On: 11/05/2020 07:56   US Abdomen Limited RUQ (LIVER/GB)  Result Date: 11/04/2020 CLINICAL DATA:  Jaundice, history of cholecystectomy EXAM: ULTRASOUND ABDOMEN LIMITED RIGHT UPPER QUADRANT COMPARISON:  11/04/2020 CT abdomen/pelvis FINDINGS: Gallbladder: Surgically absent. Common bile duct: Diameter: 20 mm. No stones in the visualized bile ducts, noting nonvisualization of the distal CBD due to overlying bowel gas. Liver: No focal lesion identified. Within normal limits in parenchymal echogenicity. Portal vein is patent on color Doppler imaging with normal direction of blood flow towards the liver. Other: None. IMPRESSION: Prominent extrahepatic biliary ductal dilatation with CBD diameter 20 mm. Suggest MRI abdomen with MRCP without and with IV contrast for further evaluation to exclude obstructing choledocholithiasis. Electronically Signed   By: Ilona Sorrel M.D.   On: 11/04/2020 20:33     Scheduled Meds:  [PVV Hold] Chlorhexidine Gluconate Cloth  6 each Topical Daily   [MAR Hold] dorzolamide-timolol  1 drop Both Eyes BID   [MAR Hold] latanoprost  1 drop Both Eyes QHS   Continuous Infusions:  sodium chloride 10 mL/hr at 11/06/20 0620   [MAR Hold] ceFEPime (MAXIPIME) IV Stopped (11/06/20 0444)   [MAR Hold] metronidazole Stopped (11/06/20 0120)     LOS: 2 days    Time spent: 40 minutes   Jonnie Finner, DO [facility]  If 7PM-7AM, please contact night-coverage [email or web address] 11/06/2020, 8:13 AM

## 2020-11-06 NOTE — Anesthesia Postprocedure Evaluation (Signed)
Anesthesia Post Note  Patient: Paula Bentley  Procedure(s) Performed: ENDOSCOPIC RETROGRADE CHOLANGIOPANCREATOGRAPHY (ERCP) SPHINCTEROTOMY REMOVAL OF STONES     Patient location during evaluation: Endoscopy Anesthesia Type: General Level of consciousness: awake and alert Pain management: pain level controlled Vital Signs Assessment: post-procedure vital signs reviewed and stable Respiratory status: spontaneous breathing, nonlabored ventilation, respiratory function stable and patient connected to nasal cannula oxygen Cardiovascular status: blood pressure returned to baseline and stable Postop Assessment: no apparent nausea or vomiting Anesthetic complications: no   No notable events documented.  Last Vitals:  Vitals:   11/06/20 1300 11/06/20 1400  BP: (!) 180/91 (!) 178/91  Pulse: (!) 101 (!) 46  Resp: (!) 30 14  Temp:    SpO2: 100% 99%    Last Pain:  Vitals:   11/06/20 1200  TempSrc: Oral  PainSc: 0-No pain                 Belenda Cruise P Jaikob Borgwardt

## 2020-11-06 NOTE — Progress Notes (Signed)
Date and time results received: 11/06/20 1831 (use smartphrase ".now" to insert current time)  Test: Lactic Acid Critical Value: 2.2  Name of Provider Notified: Dr. Marylyn Ishihara  Orders Received? Or Actions Taken?: Awaiting orders

## 2020-11-06 NOTE — Progress Notes (Signed)
Paula Bentley 7:44 AM  Subjective: Patient doing well today without any complaints tolerated clear liquids yesterday and we rediscussed the procedure and discussed her possible need for biopsy next depending on these findings  Objective: Vital signs stable afebrile no acute distress exam please see preassessment evaluation decreased white count decreased liver tests BUN and creatinine okay  Assessment: Resolving cholangitis abnormal MRCP possible liver mass  Plan: Okay to proceed with ERCP with anesthesia assistance with further work-up and plans pending those findings  [facility]  office [phone] After 5PM or if no answer call [phone]

## 2020-11-07 ENCOUNTER — Encounter (HOSPITAL_COMMUNITY): Payer: Self-pay | Admitting: Gastroenterology

## 2020-11-07 DIAGNOSIS — K8309 Other cholangitis: Secondary | ICD-10-CM

## 2020-11-07 LAB — COMPREHENSIVE METABOLIC PANEL
ALT: 79 U/L — ABNORMAL HIGH (ref 0–44)
AST: 29 U/L (ref 15–41)
Albumin: 2.7 g/dL — ABNORMAL LOW (ref 3.5–5.0)
Alkaline Phosphatase: 68 U/L (ref 38–126)
Anion gap: 8 (ref 5–15)
BUN: 20 mg/dL (ref 8–23)
CO2: 19 mmol/L — ABNORMAL LOW (ref 22–32)
Calcium: 8.9 mg/dL (ref 8.9–10.3)
Chloride: 110 mmol/L (ref 98–111)
Creatinine, Ser: 0.66 mg/dL (ref 0.44–1.00)
GFR, Estimated: >60 mL/min (ref >60)
Glucose, Bld: 138 mg/dL — ABNORMAL HIGH (ref 70–99)
Potassium: 3.8 mmol/L (ref 3.5–5.1)
Sodium: 137 mmol/L (ref 135–145)
Total Bilirubin: 1.5 mg/dL — ABNORMAL HIGH (ref 0.3–1.2)
Total Protein: 5.8 g/dL — ABNORMAL LOW (ref 6.5–8.1)

## 2020-11-07 LAB — CBC
HCT: 37.3 % (ref 36.0–46.0)
Hemoglobin: 12.4 g/dL (ref 12.0–15.0)
MCH: 31.2 pg (ref 26.0–34.0)
MCHC: 33.2 g/dL (ref 30.0–36.0)
MCV: 94 fL (ref 80.0–100.0)
Platelets: 128 10*3/uL — ABNORMAL LOW (ref 150–400)
RBC: 3.97 MIL/uL (ref 3.87–5.11)
RDW: 14.6 % (ref 11.5–15.5)
WBC: 10.1 10*3/uL (ref 4.0–10.5)
nRBC: 0 % (ref 0.0–0.2)

## 2020-11-07 LAB — MAGNESIUM: Magnesium: 1.7 mg/dL (ref 1.7–2.4)

## 2020-11-07 MED ORDER — ORAL CARE MOUTH RINSE
15.0000 mL | Freq: Two times a day (BID) | OROMUCOSAL | Status: DC
  Administered 2020-11-07 – 2020-11-11 (×9): 15 mL via OROMUCOSAL

## 2020-11-07 NOTE — TOC Initial Note (Signed)
Transition of Care [facility]) - Initial/Assessment Note    Patient Details  Name: Paula Bentley MRN: [id number] Date of Birth: 06/14/1940  Transition of [facility] CM/SW Contact:    Leeroy Cha, RN Phone Number: 11/07/2020, 8:17 AM  Clinical Narrative:                 80 year old female with history of hypertension, glaucoma, PE on Xarelto who presented to the emergency room with complaints of nausea, vomiting, chills, abdominal pain.  She developed the symptoms 3 days ago and was unable to tolerate any food or drink.  Pain was mostly across the right upper quadrant.  She has history of cholecystectomy  20 years ago.  On arrival she was febrile with fever of 40 C, hypotensive. chest x-ray did not show pneumonia.  Lab work showed elevated liver enzymes along with T bili.  Lipase was normal.  CT abdomen/pelvis showed air in the biliary tree,dilated CBD .  Patient was admitted for septic shock secondary to biliary source.  MRCP showed pneumobilia, dilated CBD. She is now s/p ERCP w/ [facility]. 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                           - Prior biliary sphincterotomy appeared open.                           - Choledocholithiasis was found. Complete removal                           was accomplished by biliary sphincterotomy and                           balloon extraction.                           - A biliary sphincterotomy was performed.                           - The biliary tree was swept and nothing was found                           at the end of the procedure. PLAN: to follow for progression there is a note about a liver lesion will follow for toc needs. Expected Discharge Plan: Home/Self Care Barriers to Discharge: Continued Medical Work up   Patient Goals and CMS Choice        Expected Discharge Plan and Services Expected Discharge Plan: Home/Self Care   Discharge Planning Services: CM Consult   Living  arrangements for the past 2 months: Apartment                                      Prior Living Arrangements/Services Living arrangements for the past 2 months: Apartment Lives with:: Self (widowed)                   Activities of Daily Living Home Assistive Devices/Equipment: Cane (specify quad or straight), Eyeglasses ADL Screening (condition at time of admission) Patient's cognitive ability adequate to safely complete daily activities?: Yes  Is the patient deaf or have difficulty hearing?: No Does the patient have difficulty seeing, even when wearing glasses/contacts?: No Does the patient have difficulty concentrating, remembering, or making decisions?: No Patient able to express need for assistance with ADLs?: Yes Does the patient have difficulty dressing or bathing?: No Independently performs ADLs?: Yes (appropriate for developmental age) Does the patient have difficulty walking or climbing stairs?: No Weakness of Legs: Both Weakness of Arms/Hands: Both  Permission Sought/Granted                  Emotional Assessment         Alcohol / Substance Use: Not Applicable Psych Involvement: No (comment)  Admission diagnosis:  Jaundice [R17] SIRS (systemic inflammatory response syndrome) (HCC) [R65.10] Abnormal magnetic resonance cholangiopancreatography (MRCP) [R93.3] Patient Active Problem List   Diagnosis Date Noted   Sepsis ([place]) 11/04/2020   History of pulmonary embolism 11/04/2020   Hypertension 11/04/2020   Elevated LFTs 11/04/2020   Jaundice    PCP:  Lucianne Lei, MD Pharmacy:   [id number] [place], [place] [address] [place] [address] Phone: [phone] Fax: [phone]     Social Determinants of Health (SDOH) Interventions    Readmission Risk Interventions No flowsheet data found.

## 2020-11-07 NOTE — Progress Notes (Signed)
PROGRESS NOTE    Paula Bentley  NWG:[id number] DOB: 08-08-1940 DOA: 11/04/2020 PCP: Lucianne Lei, MD   Brief Narrative:   Patient is 80 year old female with history of hypertension, glaucoma, PE on Xarelto who presented to the emergency room with complaints of nausea, vomiting, chills, abdominal pain.  She developed the symptoms 3 days ago and was unable to tolerate any food or drink.  Pain was mostly across the right upper quadrant.  She has history of cholecystectomy  20 years ago.  On arrival she was febrile with fever of 40 C, hypotensive. chest x-ray did not show pneumonia.  Lab work showed elevated liver enzymes along with T bili.  Lipase was normal.  CT abdomen/pelvis showed air in the biliary tree,dilated CBD .  Patient was admitted for septic shock secondary to biliary source.  MRCP showed pneumobilia, dilated CBD. She is now s/p ERCP w/ [facility].  Assessment & Plan: Septic shock     - Presented with hypotension, high-grade fever, chills.     - Started on broad-spectrum antibiotics, IV fluids.     - Sepsis is thought to be secondary to biliary source.     - stones removed during ERCP; continuing cefepime/flagyl 11/07/2020: GI input is appreciated.  Patient is on IV cefepime and Flagyl for cholangitis.  No systemic symptoms reported today.   Cholangitis/possible choledocholithiasis     - Elevated liver enzymes with T bili on presentation.     - MRCP showed left greater than right intrahepatic biliary duct dilatation with pneumobilia. CBD of 14 mm diameter and possible punctate filling defect in the mid common bile Duct.     [name] consulted, now s/p ERCP     - stones removed during ERCP; continuing cefepime/flagyl 11/07/2020: See above.   Liver lesion     - MRI also showed 4.4 x 2.4 cm bilobed lesion in the posterior left liver, features are nonspecific.      - Primary hepatic neoplasm or metastatic disease cannot be ruled out     - alpha-fetoprotein ordered     -  IR consulted for Bx 11/07/2020: Lesion seems to look like hemangioma.  For repeat imaging in a month.   History of PE     - On Xarelto at home.       - started on heparin drip by the admitting physician, currently on hold due to thrombocytopenia   History of hypertension:      - Hypotensive on presentation; BP meds held     - BP is up, can resume anti-hypertensives   Hypokalemia     - K+ replaced, follow; check Mg2+ 11/07/2020: Resolved.  Potassium is 3.8.  Magnesium is 1.7.   Thrombocytopenia     - Most likely associated  with sepsis     - xarelto, heparin gtt held     - plts 114 this AM, no evidence of bleed, continue to trend 11/07/2020: Stable.  Platelet count is 128 today.   Glaucoma     - Continue home eyedrops.  Bradycardia     - she's been bradycardic the last day     - intake EKG showed NSR; let's rpt an EKG     - rpt EKG showed jxnal rhythms, spoke with cardiology (Dr. Terri Skains); getting another EKG, rpt metabolic profile, check TSH, BNP     - BNP is up. Will give her a OT dose of lasix (spoke with cards about this)     - let's mobilize her  around the room, HR seems to be coming up an rpt EKG shows more sinus brady  DVT prophylaxis: SCDs Code Status: FULL Family Communication: spoke w/ sister by phone   Status is: Inpatient  Remains inpatient appropriate because:Inpatient level of care appropriate due to severity of illness  Dispo: The patient is from: Home              Anticipated d/c is to: Home              Patient currently is not medically stable to d/c.   Difficult to place patient No  Consultants:  GI IR  Procedures:  ERCP  Antimicrobials:  Cefepime, flagyl   Subjective: No complaints. No fever or chills.  Objective: Vitals:   11/07/20 0334 11/07/20 0500 11/07/20 0800 11/07/20 0805  BP:    (!) 144/83  Pulse:   (!) 46 (!) 56  Resp:   (!) 23 16  Temp: 97.9 F (36.6 C)  97.6 F (36.4 C)   TempSrc: Oral  Axillary   SpO2:   95% 97%  Weight:   82.2 kg    Height:        Intake/Output Summary (Last 24 hours) at 11/07/2020 1000 Last data filed at 11/07/2020 0919 Gross per 24 hour  Intake 931.44 ml  Output --  Net 931.44 ml    Filed Weights   11/06/20 0500 11/06/20 0708 11/07/20 0500  Weight: 81.2 kg 81.2 kg 82.2 kg    Examination:  General: Patient is not in any distress.  Patient is awake and alert.   Eyes: PERRL, normal sclera Neck: Supple Cardiovascular: S1-S2.   Respiratory: Clear to auscultation.   GI: Obese, soft and nontender.  Organs are difficult to assess.   Neuro: Awake and alert.  Patient moves all extremities. Extremities: No leg edema.  Data Reviewed: I have personally reviewed following labs and imaging studies.  CBC: Recent Labs  Lab 11/04/20 1515 11/05/20 0500 11/06/20 0257 11/07/20 0302  WBC 5.1 17.4* 11.2* 10.1  NEUTROABS 4.9  --   --   --   HGB 13.8 11.2* 11.4* 12.4  HCT 40.4 32.9* 34.5* 37.3  MCV 92.7 94.0 95.8 94.0  PLT 126* 109* 114* 128*    Basic Metabolic Panel: Recent Labs  Lab 11/04/20 1515 11/05/20 0500 11/06/20 0257 11/06/20 1511 11/06/20 1617 11/07/20 0302  NA 139 139 138  --  137 137  K 3.5 3.4* 4.0  --  4.8 3.8  CL 108 112* 115*  --  111 110  CO2 18* 21* 18*  --  16* 19*  GLUCOSE 167* 133* 78  --  140* 138*  BUN 20 18 16   --  19 20  CREATININE 0.83 0.77 0.61  --  0.70 0.66  CALCIUM 9.2 8.2* 8.2*  --  8.8* 8.9  MG  --   --   --  1.9  --  1.7    GFR: Estimated Creatinine Clearance: 56.9 mL/min (by C-G formula based on SCr of 0.66 mg/dL). Liver Function Tests: Recent Labs  Lab 11/04/20 1515 11/05/20 0500 11/06/20 0257 11/06/20 1617 11/07/20 0302  AST 300* 125* 55* 45* 29  ALT 224* 148* 103* 95* 79*  ALKPHOS 128* 79 71 81 68  BILITOT 7.0* 3.5* 1.7* 1.5* 1.5*  PROT 6.7 5.6* 5.7* 6.3* 5.8*  ALBUMIN 3.2* 2.6* 2.7* 2.9* 2.7*    Recent Labs  Lab 11/04/20 1515  LIPASE 18    No results for input(s): AMMONIA in the last 168  hours. Coagulation  Profile: Recent Labs  Lab 11/04/20 1631  INR 1.8*    Cardiac Enzymes: No results for input(s): CKTOTAL, CKMB, CKMBINDEX, TROPONINI in the last 168 hours. BNP (last 3 results) No results for input(s): PROBNP in the last 8760 hours. HbA1C: No results for input(s): HGBA1C in the last 72 hours. CBG: No results for input(s): GLUCAP in the last 168 hours. Lipid Profile: No results for input(s): CHOL, HDL, LDLCALC, TRIG, CHOLHDL, LDLDIRECT in the last 72 hours. Thyroid Function Tests: Recent Labs    11/06/20 1617  TSH 0.782   Anemia Panel: No results for input(s): VITAMINB12, FOLATE, FERRITIN, TIBC, IRON, RETICCTPCT in the last 72 hours. Sepsis Labs: Recent Labs  Lab 11/04/20 1515 11/05/20 0912 11/06/20 1617  PROCALCITON 4.81  --   --   LATICACIDVEN 1.6 1.6 2.2*     Recent Results (from the past 240 hour(s))  Resp Panel by RT-PCR (Flu A&B, Covid) Nasopharyngeal Swab     Status: None   Collection Time: 11/04/20  4:31 PM   Specimen: Nasopharyngeal Swab; Nasopharyngeal(NP) swabs in vial transport medium  Result Value Ref Range Status   SARS Coronavirus 2 by RT PCR NEGATIVE NEGATIVE Final    Comment: (NOTE) SARS-CoV-2 target nucleic acids are NOT DETECTED.  The SARS-CoV-2 RNA is generally detectable in upper respiratory specimens during the acute phase of infection. The lowest concentration of SARS-CoV-2 viral copies this assay can detect is 138 copies/mL. A negative result does not preclude SARS-Cov-2 infection and should not be used as the sole basis for treatment or other patient management decisions. A negative result may occur with  improper specimen collection/handling, submission of specimen other than nasopharyngeal swab, presence of viral mutation(s) within the areas targeted by this assay, and inadequate number of viral copies(<138 copies/mL). A negative result must be combined with clinical observations, patient history, and epidemiological information. The  expected result is Negative.  Fact Sheet for Patients:  EntrepreneurPulse.com.au  Fact Sheet for Healthcare Providers:  IncredibleEmployment.be  This test is no t yet approved or cleared by the [place] FDA and  has been authorized for detection and/or diagnosis of SARS-CoV-2 by FDA under an Emergency Use Authorization (EUA). This EUA will remain  in effect (meaning this test can be used) for the duration of the COVID-19 declaration under Section 564(b)(1) of the Act, 21 U.S.C.section 360bbb-3(b)(1), unless the authorization is terminated  or revoked sooner.       Influenza A by PCR NEGATIVE NEGATIVE Final   Influenza B by PCR NEGATIVE NEGATIVE Final    Comment: (NOTE) The Xpert Xpress SARS-CoV-2/FLU/RSV plus assay is intended as an aid in the diagnosis of influenza from Nasopharyngeal swab specimens and should not be used as a sole basis for treatment. Nasal washings and aspirates are unacceptable for Xpert Xpress SARS-CoV-2/FLU/RSV testing.  Fact Sheet for Patients: EntrepreneurPulse.com.au  Fact Sheet for Healthcare Providers: IncredibleEmployment.be  This test is not yet approved or cleared by the [place] FDA and has been authorized for detection and/or diagnosis of SARS-CoV-2 by FDA under an Emergency Use Authorization (EUA). This EUA will remain in effect (meaning this test can be used) for the duration of the COVID-19 declaration under Section 564(b)(1) of the Act, 21 U.S.C. section 360bbb-3(b)(1), unless the authorization is terminated or revoked.  Performed at [facility], [place] [address] [place], [place] [address]   Blood Culture (routine x 2)     Status: None (Preliminary result)   Collection Time: 11/04/20  4:31  PM   Specimen: BLOOD  Result Value Ref Range Status   Specimen Description   Final    BLOOD RIGHT ANTECUBITAL Performed at [place] [address] [place], [place] [address]    Special Requests   Final    BOTTLES DRAWN AEROBIC AND ANAEROBIC Blood Culture results may not be optimal due to an inadequate volume of blood received in culture bottles Performed at [place] [address] [place], [place] [address]    Culture   Final    NO GROWTH 3 DAYS Performed at [facility], [place] [address], [place], [place] [address]    Report Status PENDING  Incomplete  Blood Culture (routine x 2)     Status: None (Preliminary result)   Collection Time: 11/04/20  4:36 PM   Specimen: BLOOD RIGHT ARM  Result Value Ref Range Status   Specimen Description   Final    BLOOD RIGHT ARM Performed at [facility], [place] [address], [place], [place] [address]    Special Requests   Final    BOTTLES DRAWN AEROBIC AND ANAEROBIC Blood Culture results may not be optimal due to an inadequate volume of blood received in culture bottles Performed at [employer] [facility] [address] [place], [place] [address]    Culture   Final    NO GROWTH 3 DAYS Performed at [facility], [place] [address], [place], [place] [address]    Report Status PENDING  Incomplete  MRSA PCR Screening     Status: None   Collection Time: 11/05/20  9:00 AM   Specimen: Nasopharyngeal  Result Value Ref Range Status   MRSA by PCR NEGATIVE NEGATIVE Final    Comment:        The GeneXpert MRSA Assay (FDA approved for NASAL specimens only), is one component of a comprehensive MRSA colonization surveillance program. It is not intended to diagnose MRSA infection nor to guide or monitor treatment for MRSA infections. Performed at [facility], [place] [address] [place], [place] [address]   Urine culture     Status: None   Collection Time: 11/05/20 12:46 PM   Specimen: In/Out Cath Urine  Result Value Ref Range Status   Specimen Description   Final    IN/OUT CATH URINE Performed at [facility], [place] [address], [place], [place] [address]    Special Requests   Final    NONE Performed at [facility], [place] [address], [place], [place] [address]    Culture   Final    NO GROWTH Performed at [facility], [place] [address] [place], [place] [address]    Report Status 11/06/2020 FINAL  Final       Radiology Studies: DG ERCP BILIARY & PANCREATIC DUCTS  Result Date: 11/06/2020 CLINICAL DATA:  80 year old female with a history of dilated biliary ducts EXAM: ERCP TECHNIQUE: Multiple spot images obtained with the fluoroscopic device and submitted for interpretation post-procedure. FLUOROSCOPY TIME:  Fluoroscopy Time:  5 minutes 44 second COMPARISON:  MR 11/05/2020 FINDINGS: Limited intraoperative fluoroscopic spot images during ERCP. Initial image demonstrates endoscope projecting over the upper abdomen with a safety wire position and partial opacification of the extrahepatic biliary ducts. Subsequently there is passage of a balloon retrieval catheter. IMPRESSION: Limited images during ERCP demonstrates deployment of balloon retrieval catheter. Please refer to the dictated operative report for full details of intraoperative findings and procedure. Electronically  Signed   By: Corrie Mckusick D.O.   On: 11/06/2020 10:13     Scheduled Meds:  Chlorhexidine Gluconate Cloth  6 each Topical Daily   dorzolamide-timolol  1 drop Both Eyes BID   latanoprost  1 drop Both Eyes QHS   losartan  25 mg Oral Daily   mouth rinse  15 mL Mouth Rinse BID   Continuous Infusions:  ceFEPime (MAXIPIME) IV Stopped (11/07/20 0425)   metronidazole Stopped (11/07/20 0912)     LOS: 3 days    Time spent: 35 minutes   Bonnell Public, MD [facility]  If 7PM-7AM, please contact night-coverage [email or web address] 11/07/2020, 10:00 AM

## 2020-11-07 NOTE — Progress Notes (Signed)
[facility] Gastroenterology Progress Note  Paula Bentley 80 y.o. Mar 28, 1941  CC:  cholangitis s/p ERCP   Subjective: Patient with resolving cholangitis status post ERCP with Dr. Watt Climes yesterday with stone removal and sphincterotomy.  Patient doing well today tolerating clear liquids without any complaints. Patient had a bowel movement this morning, brown soft.  Patient denies any nausea, vomiting, abdominal pain.  Denies any fevers or chills.  AST trending down from 45 to 29.  ALT from 95 to 79.  Total bilirubin 1.5.  Alkaline phosphatase 68.  Total protein 5.8.  BUN stable at 20, creatinine 0.66. White blood cell count improved from 11.2 now 10.1.  Hemoglobin 12.4 improving from 11.4.  Platelets improving from 114 now 128.  ERCP 11/06/20 - The major papilla was adjacent to a diverticulum. - Prior biliary sphincterotomy appeared open. - Choledocholithiasis was found. Complete removal was accomplished by biliary sphincterotomy and balloon extraction. - A biliary sphincterotomy was performed. - The biliary tree was swept and nothing was found at the end of the procedure.  ROS : Review of Systems  Constitutional:  Negative for chills and fever.  Respiratory:  Negative for shortness of breath.   Cardiovascular:  Negative for chest pain.  Gastrointestinal:  Negative for abdominal pain, blood in stool, constipation, diarrhea, melena, nausea and vomiting.  Musculoskeletal:  Negative for falls.  Neurological:  Negative for loss of consciousness.  Psychiatric/Behavioral:  Negative for memory loss.      Objective: Vital signs in last 24 hours: Vitals:   11/07/20 0800 11/07/20 0805  BP:  (!) 144/83  Pulse: (!) 46 (!) 56  Resp: (!) 23 16  Temp: 97.6 F (36.4 C)   SpO2: 95% 97%    Physical Exam: Physical Exam Constitutional:      General: She is not in acute distress.    Appearance: She is not ill-appearing.  Eyes:     General: No scleral icterus.    Conjunctiva/sclera:  Conjunctivae normal.  Abdominal:     General: Bowel sounds are normal.     Palpations: Abdomen is soft.     Tenderness: There is no abdominal tenderness. There is no guarding or rebound.  Skin:    General: Skin is warm.     Coloration: Skin is not jaundiced.  Neurological:     General: No focal deficit present.     Mental Status: She is alert and oriented to person, place, and time.  Psychiatric:        Mood and Affect: Mood normal.        Behavior: Behavior normal.     Lab Results: Recent Labs    11/06/20 1511 11/06/20 1617 11/07/20 0302  NA  --  137 137  K  --  4.8 3.8  CL  --  111 110  CO2  --  16* 19*  GLUCOSE  --  140* 138*  BUN  --  19 20  CREATININE  --  0.70 0.66  CALCIUM  --  8.8* 8.9  MG 1.9  --  1.7   Recent Labs    11/06/20 1617 11/07/20 0302  AST 45* 29  ALT 95* 79*  ALKPHOS 81 68  BILITOT 1.5* 1.5*  PROT 6.3* 5.8*  ALBUMIN 2.9* 2.7*   Recent Labs    11/04/20 1515 11/05/20 0500 11/06/20 0257 11/07/20 0302  WBC 5.1   < > 11.2* 10.1  NEUTROABS 4.9  --   --   --   HGB 13.8   < > 11.4*  12.4  HCT 40.4   < > 34.5* 37.3  MCV 92.7   < > 95.8 94.0  PLT 126*   < > 114* 128*   < > = values in this interval not displayed.   Recent Labs    11/04/20 1631  LABPROT 20.6*  INR 1.8*    Lab Results: Results for orders placed or performed during the hospital encounter of 11/04/20 (from the past 48 hour(s))  AFP tumor marker     Status: None   Collection Time: 11/05/20 11:54 AM  Result Value Ref Range   AFP, Serum, Tumor Marker 2.0 0.0 - 9.2 ng/mL    Comment: (NOTE) Roche Diagnostics Electrochemiluminescence Immunoassay (ECLIA) Values obtained with different assay methods or kits cannot be used interchangeably.  Results cannot be interpreted as absolute evidence of the presence or absence of malignant disease. This test is not interpretable in pregnant females. Performed At: [facility] [place], [place] [id number] Rush Farmer MD [id number]   Urine culture     Status: None   Collection Time: 11/05/20 12:46 PM   Specimen: In/Out Cath Urine  Result Value Ref Range   Specimen Description      IN/OUT CATH URINE Performed at [facility], [place] [address], [place], [place] [address]    Special Requests      NONE Performed at [facility], [place] [address] [place], [place] [address]    Culture      NO GROWTH Performed at [facility], [place] [address], [place], [place] [address]    Report Status 11/06/2020 FINAL   Comprehensive metabolic panel     Status: Abnormal   Collection Time: 11/06/20  2:57 AM  Result Value Ref Range   Sodium 138 135 - 145 mmol/L   Potassium 4.0 3.5 - 5.1 mmol/L   Chloride 115 (H) 98 - 111 mmol/L   CO2 18 (L) 22 - 32 mmol/L   Glucose, Bld 78 70 - 99 mg/dL    Comment: Glucose reference range applies only to samples taken after fasting for at least 8 hours.   BUN 16 8 - 23 mg/dL   Creatinine, Ser 0.61 0.44 - 1.00 mg/dL   Calcium 8.2 (L) 8.9 - 10.3 mg/dL   Total Protein 5.7 (L) 6.5 - 8.1 g/dL   Albumin 2.7 (L) 3.5 - 5.0 g/dL   AST 55 (H) 15 - 41 U/L   ALT 103 (H) 0 - 44 U/L   Alkaline Phosphatase 71 38 - 126 U/L   Total Bilirubin 1.7 (H) 0.3 - 1.2 mg/dL   GFR, Estimated >60 >60 mL/min    Comment: (NOTE) Calculated using the CKD-EPI Creatinine Equation (2021)    Anion gap 5 5 - 15    Comment: Performed at [facility], [place] [address], [place], [place] [address]  CBC     Status: Abnormal   Collection Time: 11/06/20  2:57 AM  Result Value Ref Range   WBC 11.2 (H) 4.0 - 10.5 K/uL   RBC 3.60 (L) 3.87 - 5.11 MIL/uL   Hemoglobin 11.4 (L) 12.0 - 15.0 g/dL   HCT 34.5 (L) 36.0 - 46.0 %   MCV 95.8 80.0 - 100.0 fL   MCH 31.7 26.0 - 34.0 pg   MCHC 33.0 30.0 - 36.0 g/dL   RDW 15.0 11.5 - 15.5 %   Platelets 114 (L) 150 - 400 K/uL    Comment: SPECIMEN CHECKED FOR CLOTS Immature Platelet  Fraction may be clinically  indicated, consider ordering this additional test [id number] CONSISTENT WITH PREVIOUS RESULT REPEATED TO VERIFY    nRBC 0.0 0.0 - 0.2 %    Comment: Performed at [facility], [place] [address], [place], [place] [address]  Magnesium     Status: None   Collection Time: 11/06/20  3:11 PM  Result Value Ref Range   Magnesium 1.9 1.7 - 2.4 mg/dL    Comment: Performed at [facility], [place] [address], [place], [place] [address]  Comprehensive metabolic panel     Status: Abnormal   Collection Time: 11/06/20  4:17 PM  Result Value Ref Range   Sodium 137 135 - 145 mmol/L   Potassium 4.8 3.5 - 5.1 mmol/L   Chloride 111 98 - 111 mmol/L   CO2 16 (L) 22 - 32 mmol/L   Glucose, Bld 140 (H) 70 - 99 mg/dL    Comment: Glucose reference range applies only to samples taken after fasting for at least 8 hours.   BUN 19 8 - 23 mg/dL   Creatinine, Ser 0.70 0.44 - 1.00 mg/dL   Calcium 8.8 (L) 8.9 - 10.3 mg/dL   Total Protein 6.3 (L) 6.5 - 8.1 g/dL   Albumin 2.9 (L) 3.5 - 5.0 g/dL   AST 45 (H) 15 - 41 U/L   ALT 95 (H) 0 - 44 U/L   Alkaline Phosphatase 81 38 - 126 U/L   Total Bilirubin 1.5 (H) 0.3 - 1.2 mg/dL   GFR, Estimated >60 >60 mL/min    Comment: (NOTE) Calculated using the CKD-EPI Creatinine Equation (2021)    Anion gap 10 5 - 15    Comment: Performed at [facility], [place] [address], [place], [place] [address]  Lactic acid, plasma     Status: Abnormal   Collection Time: 11/06/20  4:17 PM  Result Value Ref Range   Lactic Acid, Venous 2.2 (HH) 0.5 - 1.9 mmol/L    Comment: CRITICAL RESULT CALLED TO, READ BACK BY AND VERIFIED WITH: A.MANSFIELD, RN AT 1831 ON 06.09.22 BY N.THOMPSON Performed at [place] [address], [place], [place] [address]   TSH     Status: None   Collection Time: 11/06/20  4:17 PM  Result Value Ref Range   TSH 0.782 0.350 - 4.500 uIU/mL    Comment: Performed by a 3rd Generation assay with a  functional sensitivity of <=0.01 uIU/mL. Performed at [facility], [place] [address] [place], [place] [address]   Brain natriuretic peptide     Status: Abnormal   Collection Time: 11/06/20  4:17 PM  Result Value Ref Range   B Natriuretic Peptide 642.4 (H) 0.0 - 100.0 pg/mL    Comment: Performed at [facility], [place] [address], [place], [place] [address]  Comprehensive metabolic panel     Status: Abnormal   Collection Time: 11/07/20  3:02 AM  Result Value Ref Range   Sodium 137 135 - 145 mmol/L   Potassium 3.8 3.5 - 5.1 mmol/L    Comment: DELTA CHECK NOTED   Chloride 110 98 - 111 mmol/L   CO2 19 (L) 22 - 32 mmol/L   Glucose, Bld 138 (H) 70 - 99 mg/dL    Comment: Glucose reference range applies only to samples taken after fasting for at least 8 hours.   BUN 20 8 - 23 mg/dL   Creatinine, Ser 0.66 0.44 - 1.00 mg/dL   Calcium 8.9 8.9 -  10.3 mg/dL   Total Protein 5.8 (L) 6.5 - 8.1 g/dL   Albumin 2.7 (L) 3.5 - 5.0 g/dL   AST 29 15 - 41 U/L   ALT 79 (H) 0 - 44 U/L   Alkaline Phosphatase 68 38 - 126 U/L   Total Bilirubin 1.5 (H) 0.3 - 1.2 mg/dL   GFR, Estimated >60 >60 mL/min    Comment: (NOTE) Calculated using the CKD-EPI Creatinine Equation (2021)    Anion gap 8 5 - 15    Comment: Performed at [facility], [place] [address], [place], [place] [address]  CBC     Status: Abnormal   Collection Time: 11/07/20  3:02 AM  Result Value Ref Range   WBC 10.1 4.0 - 10.5 K/uL   RBC 3.97 3.87 - 5.11 MIL/uL   Hemoglobin 12.4 12.0 - 15.0 g/dL   HCT 37.3 36.0 - 46.0 %   MCV 94.0 80.0 - 100.0 fL   MCH 31.2 26.0 - 34.0 pg   MCHC 33.2 30.0 - 36.0 g/dL   RDW 14.6 11.5 - 15.5 %   Platelets 128 (L) 150 - 400 K/uL    Comment: CONSISTENT WITH PREVIOUS RESULT REPEATED TO VERIFY    nRBC 0.0 0.0 - 0.2 %    Comment: Performed at [facility], [place] [address] [place], [place] [address]  Magnesium     Status: None   Collection  Time: 11/07/20  3:02 AM  Result Value Ref Range   Magnesium 1.7 1.7 - 2.4 mg/dL    Comment: Performed at [facility], [place] [address], [place],  [address]    Studies/Results: DG ERCP BILIARY & PANCREATIC DUCTS  Result Date: 11/06/2020 CLINICAL DATA:  80 year old female with a history of dilated biliary ducts EXAM: ERCP TECHNIQUE: Multiple spot images obtained with the fluoroscopic device and submitted for interpretation post-procedure. FLUOROSCOPY TIME:  Fluoroscopy Time:  5 minutes 44 second COMPARISON:  MR 11/05/2020 FINDINGS: Limited intraoperative fluoroscopic spot images during ERCP. Initial image demonstrates endoscope projecting over the upper abdomen with a safety wire position and partial opacification of the extrahepatic biliary ducts. Subsequently there is passage of a balloon retrieval catheter. IMPRESSION: Limited images during ERCP demonstrates deployment of balloon retrieval catheter. Please refer to the dictated operative report for full details of intraoperative findings and procedure. Electronically Signed   By: Corrie Mckusick D.O.   On: 11/06/2020 10:13    Assessment/Plan: Cholangitis with sepsis s/p ERCP with CBD stone removal and sphincterotomy Labs are improving, CBC, BUN and LFTs better Can advance diet, continue supportive measures.   Liver lesion Dr. Watt Climes discussed further with Dr. Pascal Lux of IR, possible hemangioma, will need repeat MRI 1 month as outpatient  Sepsis biliary source Hospitalist following   Vladimir Crofts PA-C 11/07/2020, 10:32 AM  Contact #  [phone]

## 2020-11-08 LAB — COMPREHENSIVE METABOLIC PANEL
ALT: 57 U/L — ABNORMAL HIGH (ref 0–44)
AST: 26 U/L (ref 15–41)
Albumin: 2.8 g/dL — ABNORMAL LOW (ref 3.5–5.0)
Alkaline Phosphatase: 64 U/L (ref 38–126)
Anion gap: 6 (ref 5–15)
BUN: 22 mg/dL (ref 8–23)
CO2: 22 mmol/L (ref 22–32)
Calcium: 9.2 mg/dL (ref 8.9–10.3)
Chloride: 110 mmol/L (ref 98–111)
Creatinine, Ser: 0.72 mg/dL (ref 0.44–1.00)
GFR, Estimated: >60 mL/min (ref >60)
Glucose, Bld: 129 mg/dL — ABNORMAL HIGH (ref 70–99)
Potassium: 3.7 mmol/L (ref 3.5–5.1)
Sodium: 138 mmol/L (ref 135–145)
Total Bilirubin: 1 mg/dL (ref 0.3–1.2)
Total Protein: 5.7 g/dL — ABNORMAL LOW (ref 6.5–8.1)

## 2020-11-08 LAB — CBC
HCT: 35.9 % — ABNORMAL LOW (ref 36.0–46.0)
Hemoglobin: 12.2 g/dL (ref 12.0–15.0)
MCH: 31.4 pg (ref 26.0–34.0)
MCHC: 34 g/dL (ref 30.0–36.0)
MCV: 92.3 fL (ref 80.0–100.0)
Platelets: 127 10*3/uL — ABNORMAL LOW (ref 150–400)
RBC: 3.89 MIL/uL (ref 3.87–5.11)
RDW: 14.6 % (ref 11.5–15.5)
WBC: 10.5 10*3/uL (ref 4.0–10.5)
nRBC: 0 % (ref 0.0–0.2)

## 2020-11-08 MED ORDER — GERHARDT'S BUTT CREAM
TOPICAL_CREAM | Freq: Every day | CUTANEOUS | Status: DC
  Filled 2020-11-08: qty 1

## 2020-11-08 NOTE — Progress Notes (Signed)
PROGRESS NOTE    Paula Bentley  OXB:[id number] DOB: 03-Mar-1941 DOA: 11/04/2020 PCP: Lucianne Lei, MD   Brief Narrative:   Patient is 80 year old female with history of hypertension, glaucoma, PE on Xarelto who presented to the emergency room with complaints of nausea, vomiting, chills, abdominal pain.  She developed the symptoms 3 days ago and was unable to tolerate any food or drink.  Pain was mostly across the right upper quadrant.  She has history of cholecystectomy  20 years ago.  On arrival she was febrile with fever of 40 C, hypotensive. chest x-ray did not show pneumonia.  Lab work showed elevated liver enzymes along with T bili.  Lipase was normal.  CT abdomen/pelvis showed air in the biliary tree,dilated CBD .  Patient was admitted for septic shock secondary to biliary source.  MRCP showed pneumobilia, dilated CBD. She is now s/p ERCP w/ [facility].  11/08/2020: Patient seen.  No new changes.  Patient has remained stable.  GI input is appreciated.  Will complete course of antibiotics.  Likely discharge in the next 1 to 2 days.  Assessment & Plan: Septic shock     - Presented with hypotension, high-grade fever, chills.     - Started on broad-spectrum antibiotics, IV fluids.     - Sepsis is thought to be secondary to biliary source.     - stones removed during ERCP; continuing cefepime/flagyl 11/07/2020: GI input is appreciated.  Patient is on IV cefepime and Flagyl for cholangitis.  No systemic symptoms reported today. 11/08/2020: Complete course of antibiotics.   Cholangitis/possible choledocholithiasis     - Elevated liver enzymes with T bili on presentation.     - MRCP showed left greater than right intrahepatic biliary duct dilatation with pneumobilia. CBD of 14 mm diameter and possible punctate filling defect in the mid common bile Duct.     [name] consulted, now s/p ERCP     - stones removed during ERCP; continuing cefepime/flagyl 11/07/2020: See above.   Liver  lesion     - MRI also showed 4.4 x 2.4 cm bilobed lesion in the posterior left liver, features are nonspecific.      - Primary hepatic neoplasm or metastatic disease cannot be ruled out     - alpha-fetoprotein ordered     - IR consulted for Bx 11/07/2020: Lesion seems to look like hemangioma.  For repeat RI in a month.   History of PE     - On Xarelto at home.       - started on heparin drip by the admitting physician, currently on hold due to thrombocytopenia   History of hypertension:      - Hypotensive on presentation; BP meds held     - BP is up, can resume anti-hypertensives   Hypokalemia     - K+ replaced, follow; check Mg2+ 11/07/2020: Resolved.  Potassium is 3.8.  Magnesium is 1.7.   Thrombocytopenia     - Most likely associated  with sepsis     - xarelto, heparin gtt held     - plts 114 this AM, no evidence of bleed, continue to trend 11/07/2020: Stable.  Platelet count is 128 today. 11/08/2020: Platelet count today is 127.   Glaucoma     - Continue home eyedrops.  Bradycardia     - she's been bradycardic the last day     - intake EKG showed NSR; let's rpt an EKG     - rpt EKG showed  jxnal rhythms, spoke with cardiology (Dr. Terri Skains); getting another EKG, rpt metabolic profile, check TSH, BNP     - BNP is up. Will give her a OT dose of lasix (spoke with cards about this)     - let's mobilize her around the room, HR seems to be coming up an rpt EKG shows more sinus brady  DVT prophylaxis: SCDs Code Status: FULL Family Communication: spoke w/ sister by phone   Status is: Inpatient  Remains inpatient appropriate because:Inpatient level of care appropriate due to severity of illness  Dispo: The patient is from: Home              Anticipated d/c is to: Home              Patient currently is not medically stable to d/c.   Difficult to place patient No  Consultants:  GI IR  Procedures:  ERCP  Antimicrobials:  Cefepime, flagyl   Subjective: No complaints. No  fever or chills.  Objective: Vitals:   11/08/20 0500 11/08/20 0800 11/08/20 1200 11/08/20 1600  BP:  (!) 154/77    Pulse:  (!) 59    Resp:  (!) 23    Temp:  98.2 F (36.8 C) 98 F (36.7 C) 98.3 F (36.8 C)  TempSrc:  Oral Oral Oral  SpO2:  97%    Weight: 81.8 kg     Height:        Intake/Output Summary (Last 24 hours) at 11/08/2020 1640 Last data filed at 11/08/2020 0800 Gross per 24 hour  Intake 398.91 ml  Output --  Net 398.91 ml    Filed Weights   11/06/20 0708 11/07/20 0500 11/08/20 0500  Weight: 81.2 kg 82.2 kg 81.8 kg    Examination:  General: Patient is not in any distress.  Patient is awake and alert.   Eyes: PERRL, normal sclera Neck: Supple Cardiovascular: S1-S2.   Respiratory: Clear to auscultation.   GI: Obese, soft and nontender.  Organs are difficult to assess.   Neuro: Awake and alert.  Patient moves all extremities. Extremities: No leg edema.  Data Reviewed: I have personally reviewed following labs and imaging studies.  CBC: Recent Labs  Lab 11/04/20 1515 11/05/20 0500 11/06/20 0257 11/07/20 0302 11/08/20 0244  WBC 5.1 17.4* 11.2* 10.1 10.5  NEUTROABS 4.9  --   --   --   --   HGB 13.8 11.2* 11.4* 12.4 12.2  HCT 40.4 32.9* 34.5* 37.3 35.9*  MCV 92.7 94.0 95.8 94.0 92.3  PLT 126* 109* 114* 128* 127*    Basic Metabolic Panel: Recent Labs  Lab 11/05/20 0500 11/06/20 0257 11/06/20 1511 11/06/20 1617 11/07/20 0302 11/08/20 0244  NA 139 138  --  137 137 138  K 3.4* 4.0  --  4.8 3.8 3.7  CL 112* 115*  --  111 110 110  CO2 21* 18*  --  16* 19* 22  GLUCOSE 133* 78  --  140* 138* 129*  BUN 18 16  --  19 20 22   CREATININE 0.77 0.61  --  0.70 0.66 0.72  CALCIUM 8.2* 8.2*  --  8.8* 8.9 9.2  MG  --   --  1.9  --  1.7  --     GFR: Estimated Creatinine Clearance: 56.8 mL/min (by C-G formula based on SCr of 0.72 mg/dL). Liver Function Tests: Recent Labs  Lab 11/05/20 0500 11/06/20 0257 11/06/20 1617 11/07/20 0302 11/08/20 0244   AST 125* 55* 45* 29 26  ALT 148* 103* 95* 79* 57*  ALKPHOS 79 71 81 68 64  BILITOT 3.5* 1.7* 1.5* 1.5* 1.0  PROT 5.6* 5.7* 6.3* 5.8* 5.7*  ALBUMIN 2.6* 2.7* 2.9* 2.7* 2.8*    Recent Labs  Lab 11/04/20 1515  LIPASE 18    No results for input(s): AMMONIA in the last 168 hours. Coagulation Profile: Recent Labs  Lab 11/04/20 1631  INR 1.8*    Cardiac Enzymes: No results for input(s): CKTOTAL, CKMB, CKMBINDEX, TROPONINI in the last 168 hours. BNP (last 3 results) No results for input(s): PROBNP in the last 8760 hours. HbA1C: No results for input(s): HGBA1C in the last 72 hours. CBG: No results for input(s): GLUCAP in the last 168 hours. Lipid Profile: No results for input(s): CHOL, HDL, LDLCALC, TRIG, CHOLHDL, LDLDIRECT in the last 72 hours. Thyroid Function Tests: Recent Labs    11/06/20 1617  TSH 0.782    Anemia Panel: No results for input(s): VITAMINB12, FOLATE, FERRITIN, TIBC, IRON, RETICCTPCT in the last 72 hours. Sepsis Labs: Recent Labs  Lab 11/04/20 1515 11/05/20 0912 11/06/20 1617  PROCALCITON 4.81  --   --   LATICACIDVEN 1.6 1.6 2.2*     Recent Results (from the past 240 hour(s))  Resp Panel by RT-PCR (Flu A&B, Covid) Nasopharyngeal Swab     Status: None   Collection Time: 11/04/20  4:31 PM   Specimen: Nasopharyngeal Swab; Nasopharyngeal(NP) swabs in vial transport medium  Result Value Ref Range Status   SARS Coronavirus 2 by RT PCR NEGATIVE NEGATIVE Final    Comment: (NOTE) SARS-CoV-2 target nucleic acids are NOT DETECTED.  The SARS-CoV-2 RNA is generally detectable in upper respiratory specimens during the acute phase of infection. The lowest concentration of SARS-CoV-2 viral copies this assay can detect is 138 copies/mL. A negative result does not preclude SARS-Cov-2 infection and should not be used as the sole basis for treatment or other patient management decisions. A negative result may occur with  improper specimen collection/handling,  submission of specimen other than nasopharyngeal swab, presence of viral mutation(s) within the areas targeted by this assay, and inadequate number of viral copies(<138 copies/mL). A negative result must be combined with clinical observations, patient history, and epidemiological information. The expected result is Negative.  Fact Sheet for Patients:  EntrepreneurPulse.com.au  Fact Sheet for Healthcare Providers:  IncredibleEmployment.be  This test is no t yet approved or cleared by the [place] FDA and  has been authorized for detection and/or diagnosis of SARS-CoV-2 by FDA under an Emergency Use Authorization (EUA). This EUA will remain  in effect (meaning this test can be used) for the duration of the COVID-19 declaration under Section 564(b)(1) of the Act, 21 U.S.C.section 360bbb-3(b)(1), unless the authorization is terminated  or revoked sooner.       Influenza A by PCR NEGATIVE NEGATIVE Final   Influenza B by PCR NEGATIVE NEGATIVE Final    Comment: (NOTE) The Xpert Xpress SARS-CoV-2/FLU/RSV plus assay is intended as an aid in the diagnosis of influenza from Nasopharyngeal swab specimens and should not be used as a sole basis for treatment. Nasal washings and aspirates are unacceptable for Xpert Xpress SARS-CoV-2/FLU/RSV testing.  Fact Sheet for Patients: EntrepreneurPulse.com.au  Fact Sheet for Healthcare Providers: IncredibleEmployment.be  This test is not yet approved or cleared by the [place] FDA and has been authorized for detection and/or diagnosis of SARS-CoV-2 by FDA under an Emergency Use Authorization (EUA). This EUA will remain in effect (meaning this test can be used) for the  duration of the COVID-19 declaration under Section 564(b)(1) of the Act, 21 U.S.C. section 360bbb-3(b)(1), unless the authorization is terminated or revoked.  Performed at [facility] [place] [address] [place], [place] [address]   Blood Culture (routine x 2)     Status: None (Preliminary result)   Collection Time: 11/04/20  4:31 PM   Specimen: BLOOD  Result Value Ref Range Status   Specimen Description   Final    BLOOD RIGHT ANTECUBITAL Performed at [place] [address], [place], [place] [address]    Special Requests   Final    BOTTLES DRAWN AEROBIC AND ANAEROBIC Blood Culture results may not be optimal due to an inadequate volume of blood received in culture bottles Performed at [place] [address] [place], [place] [address]    Culture   Final    NO GROWTH 4 DAYS Performed at [facility], [place] [address], [place], [place] [address]    Report Status PENDING  Incomplete  Blood Culture (routine x 2)     Status: None (Preliminary result)   Collection Time: 11/04/20  4:36 PM   Specimen: BLOOD RIGHT ARM  Result Value Ref Range Status   Specimen Description   Final    BLOOD RIGHT ARM Performed at [facility], [place] [address], [place], [place] [address]    Special Requests   Final    BOTTLES DRAWN AEROBIC AND ANAEROBIC Blood Culture results may not be optimal due to an inadequate volume of blood received in culture bottles Performed at [place] [address] [place], [place] [address]    Culture   Final    NO GROWTH 4 DAYS Performed at [facility], [place] [address], [place], [place] [address]    Report Status PENDING  Incomplete  MRSA PCR Screening     Status: None   Collection Time: 11/05/20  9:00 AM   Specimen: Nasopharyngeal  Result Value Ref Range Status   MRSA by PCR NEGATIVE NEGATIVE Final    Comment:        The GeneXpert MRSA Assay (FDA approved for NASAL specimens only), is one component of a comprehensive MRSA colonization surveillance program. It is not intended to diagnose MRSA infection nor to guide or monitor treatment for MRSA  infections. Performed at [facility], [place] [address], [place], [place] [address]   Urine culture     Status: None   Collection Time: 11/05/20 12:46 PM   Specimen: In/Out Cath Urine  Result Value Ref Range Status   Specimen Description   Final    IN/OUT CATH URINE Performed at [facility], [place] [address], [place], [place] [address]    Special Requests   Final    NONE Performed at [facility], [place] [address], [place], [place] [address]    Culture   Final    NO GROWTH Performed at [facility], [place] [address] [place], [place] [address]    Report Status 11/06/2020 FINAL  Final       Radiology Studies: No results found.   Scheduled Meds:  Chlorhexidine Gluconate Cloth  6 each Topical Daily   dorzolamide-timolol  1 drop Both Eyes BID   Gerhardt's butt cream   Topical Daily   latanoprost  1 drop Both Eyes QHS   losartan  25 mg Oral Daily   mouth rinse  15 mL Mouth Rinse  BID   Continuous Infusions:  ceFEPime (MAXIPIME) IV Stopped (11/08/20 0430)   metronidazole Stopped (11/08/20 1014)     LOS: 4 days    Time spent: 25 minutes   Bonnell Public, MD [facility]  If 7PM-7AM, please contact night-coverage [email or web address] 11/08/2020, 4:40 PM

## 2020-11-09 LAB — CBC
HCT: 36.4 % (ref 36.0–46.0)
Hemoglobin: 12 g/dL (ref 12.0–15.0)
MCH: 31.7 pg (ref 26.0–34.0)
MCHC: 33 g/dL (ref 30.0–36.0)
MCV: 96 fL (ref 80.0–100.0)
Platelets: 119 10*3/uL — ABNORMAL LOW (ref 150–400)
RBC: 3.79 MIL/uL — ABNORMAL LOW (ref 3.87–5.11)
RDW: 14.5 % (ref 11.5–15.5)
WBC: 7.9 10*3/uL (ref 4.0–10.5)
nRBC: 0 % (ref 0.0–0.2)

## 2020-11-09 LAB — COMPREHENSIVE METABOLIC PANEL
ALT: 46 U/L — ABNORMAL HIGH (ref 0–44)
AST: 26 U/L (ref 15–41)
Albumin: 2.7 g/dL — ABNORMAL LOW (ref 3.5–5.0)
Alkaline Phosphatase: 55 U/L (ref 38–126)
Anion gap: 9 (ref 5–15)
BUN: 15 mg/dL (ref 8–23)
CO2: 21 mmol/L — ABNORMAL LOW (ref 22–32)
Calcium: 8.9 mg/dL (ref 8.9–10.3)
Chloride: 109 mmol/L (ref 98–111)
Creatinine, Ser: 0.72 mg/dL (ref 0.44–1.00)
GFR, Estimated: >60 mL/min (ref >60)
Glucose, Bld: 99 mg/dL (ref 70–99)
Potassium: 3.6 mmol/L (ref 3.5–5.1)
Sodium: 139 mmol/L (ref 135–145)
Total Bilirubin: 1.1 mg/dL (ref 0.3–1.2)
Total Protein: 5.6 g/dL — ABNORMAL LOW (ref 6.5–8.1)

## 2020-11-09 LAB — CULTURE, BLOOD (ROUTINE X 2)
Culture: NO GROWTH
Culture: NO GROWTH

## 2020-11-09 NOTE — Evaluation (Signed)
Physical Therapy Evaluation Patient Details Name: Paula Bentley MRN: [id number] DOB: [birth year] Today's Date: 11/09/2020   History of Present Illness  Pt admitted to ICU with septic shock 2* bilary source and no s/p ERCP , sphincterotomy and removal of stones.  Pt with hx of glaucoma and PE  Clinical Impression  Pt admitted as above and presenting with functional mobility limitations 2* generalized weakness and ambulatory balance deficits.  Pt initially unstable with gait but able to ambulate in hall with RW to compensate.  Pt hopes to progress to dc home with family assist and would benefit from use of RW at home.    Follow Up Recommendations No PT follow up;Home health PT (dependent on acute stay progress)    Equipment Recommendations  Rolling walker with 5" wheels    Recommendations for Other Services OT consult     Precautions / Restrictions Precautions Precautions: Fall Precaution Comments: Pt admits hx of falls Restrictions Weight Bearing Restrictions: No      Mobility  Bed Mobility Overal bed mobility: Modified Independent             General bed mobility comments: Increased time but no physical assist    Transfers Overall transfer level: Needs assistance Equipment used: None Transfers: Sit to/from Stand Sit to Stand: Min assist         General transfer comment: Min assist to bring wt up and fwd and to balance in standing  Ambulation/Gait Ambulation/Gait assistance: Min assist;Min guard Gait Distance (Feet): 400 Feet Assistive device: Rolling walker (2 wheeled);1 person hand held assist Gait Pattern/deviations: Step-through pattern;Decreased step length - right;Decreased step length - left;Shuffle;Trunk flexed     General Gait Details: Pt ambulated 100' with HHA but demonstrates  instability and stepping very tentatively with high risk of falls.  Pt ambulated additional 300' with RW and marked improvement in stability - pt reports feeling  steadier and safer.  Stairs            Wheelchair Mobility    Modified Rankin (Stroke Patients Only)       Balance Overall balance assessment: Needs assistance Sitting-balance support: No upper extremity supported;Feet supported Sitting balance-Leahy Scale: Good     Standing balance support: No upper extremity supported Standing balance-Leahy Scale: Poor                               Pertinent Vitals/Pain Pain Assessment: No/denies pain    Home Living Family/patient expects to be discharged to:: Private residence Living Arrangements: Other relatives Available Help at Discharge: Family;Available PRN/intermittently Type of Home: Apartment Home Access: Level entry     Home Layout: One level Home Equipment: Cane - single point Additional Comments: Pt lives with sister who works in housekeeping for [employer]    Prior Function Level of Independence: Independent with assistive device(s)         Comments: Used SPC as needed but admits to having fallen     Hand Dominance        Extremity/Trunk Assessment   Upper Extremity Assessment Upper Extremity Assessment: Generalized weakness    Lower Extremity Assessment Lower Extremity Assessment: Generalized weakness       Communication   Communication: No difficulties  Cognition Arousal/Alertness: Awake/alert Behavior During Therapy: WFL for tasks assessed/performed Overall Cognitive Status: Within Functional Limits for tasks assessed  General Comments      Exercises General Exercises - Lower Extremity Ankle Circles/Pumps: AROM;Both;15 reps;Supine   Assessment/Plan    PT Assessment Patient needs continued PT services  PT Problem List Decreased strength;Decreased balance;Decreased mobility;Decreased knowledge of use of DME       PT Treatment Interventions DME instruction;Gait training;Functional mobility training;Therapeutic  activities;Therapeutic exercise;Balance training;Patient/family education    PT Goals (Current goals can be found in the Care Plan section)  Acute Rehab PT Goals Patient Stated Goal: Regain IND PT Goal Formulation: With patient Time For Goal Achievement: 11/23/20 Potential to Achieve Goals: Good    Frequency Min 3X/week   Barriers to discharge        Co-evaluation               AM-PAC PT "6 Clicks" Mobility  Outcome Measure Help needed turning from your back to your side while in a flat bed without using bedrails?: None Help needed moving from lying on your back to sitting on the side of a flat bed without using bedrails?: None Help needed moving to and from a bed to a chair (including a wheelchair)?: A Little Help needed standing up from a chair using your arms (e.g., wheelchair or bedside chair)?: A Little Help needed to walk in hospital room?: A Little Help needed climbing 3-5 steps with a railing? : A Lot 6 Click Score: 19    End of Session Equipment Utilized During Treatment: Gait belt Activity Tolerance: Patient tolerated treatment well Patient left: in chair;with call bell/phone within reach;with chair alarm set Nurse Communication: Mobility status PT Visit Diagnosis: Unsteadiness on feet (R26.81);Muscle weakness (generalized) (M62.81);Difficulty in walking, not elsewhere classified (R26.2)    Time: 9767-3419 PT Time Calculation (min) (ACUTE ONLY): 21 min   Charges:   PT Evaluation $PT Eval Low Complexity: 1 Low          [place] Pager [phone] Office [phone]   Fronie Holstein 11/09/2020, 4:53 PM

## 2020-11-09 NOTE — Progress Notes (Signed)
Report called to [facility] on 5east. Pt being transferred at this time. She is still bradycardic on the monitor however otherwise all VSS.

## 2020-11-09 NOTE — Progress Notes (Signed)
PROGRESS NOTE    Paula Bentley  CXK:[id number] DOB: February 27, 1941 DOA: 11/04/2020 PCP: Lucianne Lei, MD   Brief Narrative:   Patient is 80 year old female with history of hypertension, glaucoma, PE on Xarelto who presented to the emergency room with complaints of nausea, vomiting, chills, abdominal pain.  She developed the symptoms 3 days ago and was unable to tolerate any food or drink.  Pain was mostly across the right upper quadrant.  She has history of cholecystectomy  20 years ago.  On arrival she was febrile with fever of 40 C, hypotensive. chest x-ray did not show pneumonia.  Lab work showed elevated liver enzymes along with T bili.  Lipase was normal.  CT abdomen/pelvis showed air in the biliary tree,dilated CBD .  Patient was admitted for septic shock secondary to biliary source.  MRCP showed pneumobilia, dilated CBD. She is now s/p ERCP w/ [facility].  11/09/2020: Patient seen alongside patient's 2 sisters.  Patient will need 1-2 more days of antibiotics.  No complaints today.    Assessment & Plan: Septic shock     - Presented with hypotension, high-grade fever, chills.     - Started on broad-spectrum antibiotics, IV fluids.     - Sepsis is thought to be secondary to biliary source.     - stones removed during ERCP; continuing cefepime/flagyl 11/07/2020: GI input is appreciated.  Patient is on IV cefepime and Flagyl for cholangitis.  No systemic symptoms reported today. 11/09/2020: Complete course of antibiotics.   Cholangitis/possible choledocholithiasis     - Elevated liver enzymes with T bili on presentation.     - MRCP showed left greater than right intrahepatic biliary duct dilatation with pneumobilia. CBD of 14 mm diameter and possible punctate filling defect in the mid common bile Duct.     [name] consulted, now s/p ERCP     - stones removed during ERCP; continuing cefepime/flagyl 11/07/2020: See above.   Liver lesion     - MRI also showed 4.4 x 2.4 cm bilobed  lesion in the posterior left liver, features are nonspecific.      - Primary hepatic neoplasm or metastatic disease cannot be ruled out     - alpha-fetoprotein ordered     - IR consulted for Bx 11/07/2020: Lesion seems to look like hemangioma.  For repeat RI in a month.   History of PE     - On Xarelto at home.       - started on heparin drip by the admitting physician, currently on hold due to thrombocytopenia   History of hypertension:      - Hypotensive on presentation; BP meds held     - BP is up, can resume anti-hypertensives   Hypokalemia     - K+ replaced, follow; check Mg2+ 11/07/2020: Resolved.  Potassium is 3.8.  Magnesium is 1.7.   Thrombocytopenia     - Most likely associated  with sepsis     - xarelto, heparin gtt held     - plts 114 this AM, no evidence of bleed, continue to trend 11/07/2020: Stable.  Platelet count is 128 today. 11/08/2020: Platelet count today is 127.   Glaucoma     - Continue home eyedrops.  Bradycardia     - she's been bradycardic the last day     - intake EKG showed NSR; let's rpt an EKG     - rpt EKG showed jxnal rhythms, spoke with cardiology (Dr. Terri Skains); getting another EKG, rpt  metabolic profile, check TSH, BNP     - BNP is up. Will give her a OT dose of lasix (spoke with cards about this)     - let's mobilize her around the room, HR seems to be coming up an rpt EKG shows more sinus brady  DVT prophylaxis: SCDs Code Status: FULL Family Communication: spoke w/ sister by phone   Status is: Inpatient  Remains inpatient appropriate because:Inpatient level of care appropriate due to severity of illness  Dispo: The patient is from: Home              Anticipated d/c is to: Home              Patient currently is not medically stable to d/c.   Difficult to place patient No  Consultants:  GI IR  Procedures:  ERCP  Antimicrobials:  Cefepime, flagyl   Subjective: No complaints. No fever or chills.  Objective: Vitals:   11/08/20  2000 11/09/20 0024 11/09/20 0351 11/09/20 1521  BP: (!) 122/55 (!) 144/85 (!) 132/92 128/78  Pulse: (!) 57 (!) 58 62 64  Resp: 18 20 18 16   Temp: 98.6 F (37 C) 98.7 F (37.1 C) 98.8 F (37.1 C) 98.1 F (36.7 C)  TempSrc: Oral Oral Oral Oral  SpO2: 95% 97% 99% 99%  Weight:      Height:        Intake/Output Summary (Last 24 hours) at 11/09/2020 1529 Last data filed at 11/09/2020 1500 Gross per 24 hour  Intake 701.05 ml  Output --  Net 701.05 ml    Filed Weights   11/06/20 0708 11/07/20 0500 11/08/20 0500  Weight: 81.2 kg 82.2 kg 81.8 kg    Examination:  General: Patient is not in any distress.  Patient is awake and alert.   Eyes: PERRL, normal sclera Neck: Supple Cardiovascular: S1-S2.   Respiratory: Clear to auscultation.   GI: Obese, soft and nontender.  Organs are difficult to assess.   Neuro: Awake and alert.  Patient moves all extremities. Extremities: No leg edema.  Data Reviewed: I have personally reviewed following labs and imaging studies.  CBC: Recent Labs  Lab 11/04/20 1515 11/05/20 0500 11/06/20 0257 11/07/20 0302 11/08/20 0244 11/09/20 0507  WBC 5.1 17.4* 11.2* 10.1 10.5 7.9  NEUTROABS 4.9  --   --   --   --   --   HGB 13.8 11.2* 11.4* 12.4 12.2 12.0  HCT 40.4 32.9* 34.5* 37.3 35.9* 36.4  MCV 92.7 94.0 95.8 94.0 92.3 96.0  PLT 126* 109* 114* 128* 127* 119*    Basic Metabolic Panel: Recent Labs  Lab 11/06/20 0257 11/06/20 1511 11/06/20 1617 11/07/20 0302 11/08/20 0244 11/09/20 0507  NA 138  --  137 137 138 139  K 4.0  --  4.8 3.8 3.7 3.6  CL 115*  --  111 110 110 109  CO2 18*  --  16* 19* 22 21*  GLUCOSE 78  --  140* 138* 129* 99  BUN 16  --  19 20 22 15   CREATININE 0.61  --  0.70 0.66 0.72 0.72  CALCIUM 8.2*  --  8.8* 8.9 9.2 8.9  MG  --  1.9  --  1.7  --   --     GFR: Estimated Creatinine Clearance: 56.8 mL/min (by C-G formula based on SCr of 0.72 mg/dL). Liver Function Tests: Recent Labs  Lab 11/06/20 0257 11/06/20 1617  11/07/20 0302 11/08/20 0244 11/09/20 0507  AST 55* 45* 29  26 26  ALT 103* 95* 79* 57* 46*  ALKPHOS 71 81 68 64 55  BILITOT 1.7* 1.5* 1.5* 1.0 1.1  PROT 5.7* 6.3* 5.8* 5.7* 5.6*  ALBUMIN 2.7* 2.9* 2.7* 2.8* 2.7*    Recent Labs  Lab 11/04/20 1515  LIPASE 18    No results for input(s): AMMONIA in the last 168 hours. Coagulation Profile: Recent Labs  Lab 11/04/20 1631  INR 1.8*    Cardiac Enzymes: No results for input(s): CKTOTAL, CKMB, CKMBINDEX, TROPONINI in the last 168 hours. BNP (last 3 results) No results for input(s): PROBNP in the last 8760 hours. HbA1C: No results for input(s): HGBA1C in the last 72 hours. CBG: No results for input(s): GLUCAP in the last 168 hours. Lipid Profile: No results for input(s): CHOL, HDL, LDLCALC, TRIG, CHOLHDL, LDLDIRECT in the last 72 hours. Thyroid Function Tests: Recent Labs    11/06/20 1617  TSH 0.782    Anemia Panel: No results for input(s): VITAMINB12, FOLATE, FERRITIN, TIBC, IRON, RETICCTPCT in the last 72 hours. Sepsis Labs: Recent Labs  Lab 11/04/20 1515 11/05/20 0912 11/06/20 1617  PROCALCITON 4.81  --   --   LATICACIDVEN 1.6 1.6 2.2*     Recent Results (from the past 240 hour(s))  Resp Panel by RT-PCR (Flu A&B, Covid) Nasopharyngeal Swab     Status: None   Collection Time: 11/04/20  4:31 PM   Specimen: Nasopharyngeal Swab; Nasopharyngeal(NP) swabs in vial transport medium  Result Value Ref Range Status   SARS Coronavirus 2 by RT PCR NEGATIVE NEGATIVE Final    Comment: (NOTE) SARS-CoV-2 target nucleic acids are NOT DETECTED.  The SARS-CoV-2 RNA is generally detectable in upper respiratory specimens during the acute phase of infection. The lowest concentration of SARS-CoV-2 viral copies this assay can detect is 138 copies/mL. A negative result does not preclude SARS-Cov-2 infection and should not be used as the sole basis for treatment or other patient management decisions. A negative result may occur with   improper specimen collection/handling, submission of specimen other than nasopharyngeal swab, presence of viral mutation(s) within the areas targeted by this assay, and inadequate number of viral copies(<138 copies/mL). A negative result must be combined with clinical observations, patient history, and epidemiological information. The expected result is Negative.  Fact Sheet for Patients:  EntrepreneurPulse.com.au  Fact Sheet for Healthcare Providers:  IncredibleEmployment.be  This test is no t yet approved or cleared by the [place] FDA and  has been authorized for detection and/or diagnosis of SARS-CoV-2 by FDA under an Emergency Use Authorization (EUA). This EUA will remain  in effect (meaning this test can be used) for the duration of the COVID-19 declaration under Section 564(b)(1) of the Act, 21 U.S.C.section 360bbb-3(b)(1), unless the authorization is terminated  or revoked sooner.       Influenza A by PCR NEGATIVE NEGATIVE Final   Influenza B by PCR NEGATIVE NEGATIVE Final    Comment: (NOTE) The Xpert Xpress SARS-CoV-2/FLU/RSV plus assay is intended as an aid in the diagnosis of influenza from Nasopharyngeal swab specimens and should not be used as a sole basis for treatment. Nasal washings and aspirates are unacceptable for Xpert Xpress SARS-CoV-2/FLU/RSV testing.  Fact Sheet for Patients: EntrepreneurPulse.com.au  Fact Sheet for Healthcare Providers: IncredibleEmployment.be  This test is not yet approved or cleared by the [place] FDA and has been authorized for detection and/or diagnosis of SARS-CoV-2 by FDA under an Emergency Use Authorization (EUA). This EUA will remain in effect (meaning this test can be  used) for the duration of the COVID-19 declaration under Section 564(b)(1) of the Act, 21 U.S.C. section 360bbb-3(b)(1), unless the authorization is terminated  or revoked.  Performed at [facility], [place] [address], [place], [place] [address]   Blood Culture (routine x 2)     Status: None   Collection Time: 11/04/20  4:31 PM   Specimen: BLOOD  Result Value Ref Range Status   Specimen Description   Final    BLOOD RIGHT ANTECUBITAL Performed at [place] [address], [place], [place] [address]    Special Requests   Final    BOTTLES DRAWN AEROBIC AND ANAEROBIC Blood Culture results may not be optimal due to an inadequate volume of blood received in culture bottles Performed at [place] [address], [place], [place] [address]    Culture   Final    NO GROWTH 5 DAYS Performed at [facility], [place] [address] [place], [place] [address]    Report Status 11/09/2020 FINAL  Final  Blood Culture (routine x 2)     Status: None   Collection Time: 11/04/20  4:36 PM   Specimen: BLOOD RIGHT ARM  Result Value Ref Range Status   Specimen Description   Final    BLOOD RIGHT ARM Performed at [facility], [place] [address] [place], [place] [address]    Special Requests   Final    BOTTLES DRAWN AEROBIC AND ANAEROBIC Blood Culture results may not be optimal due to an inadequate volume of blood received in culture bottles Performed at [place] [address] [place], [place] [address]    Culture   Final    NO GROWTH 5 DAYS Performed at [facility], [place] [address] [place], [place] [address]    Report Status 11/09/2020 FINAL  Final  MRSA PCR Screening     Status: None   Collection Time: 11/05/20  9:00 AM   Specimen: Nasopharyngeal  Result Value Ref Range Status   MRSA by PCR NEGATIVE NEGATIVE Final    Comment:        The GeneXpert MRSA Assay (FDA approved for NASAL specimens only), is one component of a comprehensive MRSA colonization surveillance program. It is not intended to diagnose MRSA infection nor to guide or monitor treatment  for MRSA infections. Performed at [facility], [place] [address] [place], [place] [address]   Urine culture     Status: None   Collection Time: 11/05/20 12:46 PM   Specimen: In/Out Cath Urine  Result Value Ref Range Status   Specimen Description   Final    IN/OUT CATH URINE Performed at [facility], [place] [address] [place], [place] [address]    Special Requests   Final    NONE Performed at [facility], [place] [address], [place], [place] [address]    Culture   Final    NO GROWTH Performed at [facility], [place] [address] [place], [place] [address]    Report Status 11/06/2020 FINAL  Final       Radiology Studies: No results found.   Scheduled Meds:  Chlorhexidine Gluconate Cloth  6 each Topical Daily   dorzolamide-timolol  1 drop Both Eyes BID   Gerhardt's butt cream   Topical Daily   latanoprost  1 drop Both Eyes QHS   losartan  25 mg Oral Daily   mouth rinse  15 mL Mouth  Rinse BID   Continuous Infusions:  ceFEPime (MAXIPIME) IV Stopped (11/09/20 0420)   metronidazole 500 mg (11/09/20 0919)     LOS: 5 days    Time spent: 25 minutes   Bonnell Public, MD [facility]  If 7PM-7AM, please contact night-coverage [email or web address] 11/09/2020, 3:29 PM

## 2020-11-10 DIAGNOSIS — K8050 Calculus of bile duct without cholangitis or cholecystitis without obstruction: Secondary | ICD-10-CM

## 2020-11-10 DIAGNOSIS — D696 Thrombocytopenia, unspecified: Secondary | ICD-10-CM

## 2020-11-10 MED ORDER — LOSARTAN POTASSIUM 50 MG PO TABS
25.0000 mg | ORAL_TABLET | Freq: Every day | ORAL | Status: DC
  Administered 2020-11-11: 25 mg via ORAL
  Filled 2020-11-10: qty 1

## 2020-11-10 MED ORDER — POLYETHYLENE GLYCOL 3350 17 G PO PACK
17.0000 g | PACK | Freq: Every day | ORAL | Status: DC
  Administered 2020-11-10 – 2020-11-11 (×2): 17 g via ORAL
  Filled 2020-11-10 (×2): qty 1

## 2020-11-10 NOTE — Progress Notes (Signed)
Physical Therapy Treatment Patient Details Name: Paula Bentley MRN: [id number] DOB: 16-Apr-1941 Today's Date: 11/10/2020    History of Present Illness Pt admitted to hospital on 11/04/20 for septic shock likely secondary to biliary source of infection. Pt s/p ERCP , sphincterotomy and removal of stones.  Pt with hx of glaucoma and PE    PT Comments    Pt assisted with ambulating in hallway and did not use assistive device.  Pt very unsteady and occasionally holding hand rail.  Pt encouraged to use RW or SPC at home upon d/c for safety.    Follow Up Recommendations  Home health PT     Equipment Recommendations  Rolling walker with 5" wheels    Recommendations for Other Services       Precautions / Restrictions Precautions Precautions: Fall    Mobility  Bed Mobility               General bed mobility comments: pt in recliner on arrival    Transfers Overall transfer level: Needs assistance Equipment used: None Transfers: Sit to/from Stand Sit to Stand: Min guard         General transfer comment: min/guard for safety  Ambulation/Gait Ambulation/Gait assistance: Min assist;Min guard Gait Distance (Feet): 280 Feet Assistive device: None Gait Pattern/deviations: Step-through pattern;Trunk flexed     General Gait Details: very unsteady gait however pt declined using RW, min/guard for safety as pt unsteady and occasionally grabbing rail for support, encouraged pt to use SPC or RW at home with safety upon d/c.   Stairs             Wheelchair Mobility    Modified Rankin (Stroke Patients Only)       Balance                                            Cognition Arousal/Alertness: Awake/alert Behavior During Therapy: WFL for tasks assessed/performed Overall Cognitive Status: Within Functional Limits for tasks assessed                                        Exercises      General Comments         Pertinent Vitals/Pain Pain Assessment: No/denies pain    Home Living                      Prior Function            PT Goals (current goals can now be found in the care plan section) Progress towards PT goals: Progressing toward goals    Frequency    Min 3X/week      PT Plan Current plan remains appropriate    Co-evaluation              AM-PAC PT "6 Clicks" Mobility   Outcome Measure  Help needed turning from your back to your side while in a flat bed without using bedrails?: None Help needed moving from lying on your back to sitting on the side of a flat bed without using bedrails?: None Help needed moving to and from a bed to a chair (including a wheelchair)?: A Little Help needed standing up from a chair using your arms (e.g., wheelchair or bedside chair)?: A Little Help needed  to walk in hospital room?: A Little Help needed climbing 3-5 steps with a railing? : A Lot 6 Click Score: 19    End of Session Equipment Utilized During Treatment: Gait belt Activity Tolerance: Patient tolerated treatment well Patient left: in chair;with call bell/phone within reach;with chair alarm set   PT Visit Diagnosis: Other abnormalities of gait and mobility (R26.89);Unsteadiness on feet (R26.81)     Time: 6606-3016 PT Time Calculation (min) (ACUTE ONLY): 10 min  Charges:  $Gait Training: 8-22 mins                    Jannette Spanner PT, DPT Acute Rehabilitation Services Pager: [phone] Office: [phone]  York Ram E 11/10/2020, 4:05 PM

## 2020-11-10 NOTE — Progress Notes (Addendum)
PROGRESS NOTE    Paula Bentley  XAJ:[id number] DOB: 1941/01/03 DOA: 11/04/2020 PCP: Lucianne Lei, MD   Brief Narrative:    Patient is 80 year old female with history of hypertension, glaucoma, pulmonary embolism on Xarelto presented to hospital with nausea vomiting chills and abdominal pain for 3 days with decreased p.o. tolerance.  In the ED patient was noted to have right upper quadrant tenderness though she had history of cholecystectomy in the past.  She was noted to be febrile in the ED with hypotension.  Chest x-ray did not show any pneumonia.  LFTs were elevated including total bilirubin.  Lipase was normal.  CT scan of the abdomen and pelvis showed air in the biliary tree with dilated CBD.  Patient was then admitted to hospital for septic shock likely secondary to biliary source of infection.    Assessment & Plan:  Septic shock Secondary to biliary infection.  Has resolved at this time.  On IV cefepime and Flagyl for cholangitis.  Status post ERCP with removal of stones.  Will need for total of 10 days of antibiotic.  Today is day 6 of antibiotic.  Cholangitis/possible choledocholithiasis     ERCP showed biliary dilatation with pneumobilia and filling defect.  Underwent ERCP and stones were removed.     Liver lesion MRI also showed 4.4 x 2.4 cm bilobed lesion in the posterior left liver, features are nonspecific.  Likely to be a hemangioma.  Recommendation is repetition of MRI in a month.  History of PE Patient is on Xarelto at home.  Was on heparin drip initially but was on hold due to thrombocytopenia which seems to be mild.  No evidence of bleeding.  Will resume Xarelto on discharge.   History of hypertension:  On losartan at home.  Will resume.   Hypokalemia Improved after replacement.   Thrombocytopenia Likely secondary to sepsis.  Resolved on heparin drip on hold.  If platelets remain stable or improving tomorrow will restart Xarelto for     Glaucoma    On  latanoprost and Cosopt at home.  Bradycardia Has improved.  Sinus bradycardia.  DVT prophylaxis: SCDs  Code Status: FULL  Family Communication:  None today.  Status is: Inpatient  Remains inpatient appropriate because:Inpatient level of care appropriate due to severity of illness  Dispo: The patient is from: Home              Anticipated d/c is to: Home              Patient currently is not medically stable to d/c.  Likely home in 1 to 2 days   Difficult to place patient No  Consultants:  GI IR  Procedures:  ERCP with removal of bile duct stone/.sphincterotomy and balloon extraction.  Antimicrobials:  Cefepime, flagyl IV  Subjective: Today, patient was seen and examined at bedside.  Patient complains of mild constipation.  Denies any nausea vomiting abdominal pain fever or chills.  Objective: Vitals:   11/09/20 1951 11/10/20 0327 11/10/20 0500 11/10/20 1328  BP: 136/82 135/82  123/76  Pulse: 74 61  77  Resp: (!) 21 18  16   Temp: 97.8 F (36.6 C) 98.7 F (37.1 C)  98.7 F (37.1 C)  TempSrc: Oral Oral  Oral  SpO2: 98% 97%  99%  Weight:   83 kg   Height:        Intake/Output Summary (Last 24 hours) at 11/10/2020 1354 Last data filed at 11/10/2020 0950 Gross per 24 hour  Intake 360  ml  Output --  Net 360 ml    Filed Weights   11/07/20 0500 11/08/20 0500 11/10/20 0500  Weight: 82.2 kg 81.8 kg 83 kg    Physical examination:  General: Obese built, not in obvious distress HENT:   No scleral pallor or icterus noted. Oral mucosa is moist.  Chest:  Clear breath sounds.  Diminished breath sounds bilaterally. No crackles or wheezes.  CVS: S1 &S2 heard. No murmur.  Regular rate and rhythm. Abdomen: Soft, nontender, nondistended.  Bowel sounds are heard.   Extremities: No cyanosis, clubbing or edema.  Peripheral pulses are palpable. Psych: Alert, awake and oriented, normal mood CNS:  No cranial nerve deficits.  Power equal in all extremities.   Skin: Warm and  dry.  No rashes noted.   Data Reviewed: I have personally reviewed following labs and imaging studies.  CBC: Recent Labs  Lab 11/04/20 1515 11/05/20 0500 11/06/20 0257 11/07/20 0302 11/08/20 0244 11/09/20 0507  WBC 5.1 17.4* 11.2* 10.1 10.5 7.9  NEUTROABS 4.9  --   --   --   --   --   HGB 13.8 11.2* 11.4* 12.4 12.2 12.0  HCT 40.4 32.9* 34.5* 37.3 35.9* 36.4  MCV 92.7 94.0 95.8 94.0 92.3 96.0  PLT 126* 109* 114* 128* 127* 119*    Basic Metabolic Panel: Recent Labs  Lab 11/06/20 0257 11/06/20 1511 11/06/20 1617 11/07/20 0302 11/08/20 0244 11/09/20 0507  NA 138  --  137 137 138 139  K 4.0  --  4.8 3.8 3.7 3.6  CL 115*  --  111 110 110 109  CO2 18*  --  16* 19* 22 21*  GLUCOSE 78  --  140* 138* 129* 99  BUN 16  --  19 20 22 15   CREATININE 0.61  --  0.70 0.66 0.72 0.72  CALCIUM 8.2*  --  8.8* 8.9 9.2 8.9  MG  --  1.9  --  1.7  --   --     GFR: Estimated Creatinine Clearance: 57.2 mL/min (by C-G formula based on SCr of 0.72 mg/dL). Liver Function Tests: Recent Labs  Lab 11/06/20 0257 11/06/20 1617 11/07/20 0302 11/08/20 0244 11/09/20 0507  AST 55* 45* 29 26 26   ALT 103* 95* 79* 57* 46*  ALKPHOS 71 81 68 64 55  BILITOT 1.7* 1.5* 1.5* 1.0 1.1  PROT 5.7* 6.3* 5.8* 5.7* 5.6*  ALBUMIN 2.7* 2.9* 2.7* 2.8* 2.7*    Recent Labs  Lab 11/04/20 1515  LIPASE 18    No results for input(s): AMMONIA in the last 168 hours. Coagulation Profile: Recent Labs  Lab 11/04/20 1631  INR 1.8*    Cardiac Enzymes: No results for input(s): CKTOTAL, CKMB, CKMBINDEX, TROPONINI in the last 168 hours. BNP (last 3 results) No results for input(s): PROBNP in the last 8760 hours. HbA1C: No results for input(s): HGBA1C in the last 72 hours. CBG: No results for input(s): GLUCAP in the last 168 hours. Lipid Profile: No results for input(s): CHOL, HDL, LDLCALC, TRIG, CHOLHDL, LDLDIRECT in the last 72 hours. Thyroid Function Tests: No results for input(s): TSH, T4TOTAL, FREET4,  T3FREE, THYROIDAB in the last 72 hours.  Anemia Panel: No results for input(s): VITAMINB12, FOLATE, FERRITIN, TIBC, IRON, RETICCTPCT in the last 72 hours. Sepsis Labs: Recent Labs  Lab 11/04/20 1515 11/05/20 0912 11/06/20 1617  PROCALCITON 4.81  --   --   LATICACIDVEN 1.6 1.6 2.2*     Recent Results (from the past 240 hour(s))  Resp  Panel by RT-PCR (Flu A&B, Covid) Nasopharyngeal Swab     Status: None   Collection Time: 11/04/20  4:31 PM   Specimen: Nasopharyngeal Swab; Nasopharyngeal(NP) swabs in vial transport medium  Result Value Ref Range Status   SARS Coronavirus 2 by RT PCR NEGATIVE NEGATIVE Final    Comment: (NOTE) SARS-CoV-2 target nucleic acids are NOT DETECTED.  The SARS-CoV-2 RNA is generally detectable in upper respiratory specimens during the acute phase of infection. The lowest concentration of SARS-CoV-2 viral copies this assay can detect is 138 copies/mL. A negative result does not preclude SARS-Cov-2 infection and should not be used as the sole basis for treatment or other patient management decisions. A negative result may occur with  improper specimen collection/handling, submission of specimen other than nasopharyngeal swab, presence of viral mutation(s) within the areas targeted by this assay, and inadequate number of viral copies(<138 copies/mL). A negative result must be combined with clinical observations, patient history, and epidemiological information. The expected result is Negative.  Fact Sheet for Patients:  EntrepreneurPulse.com.au  Fact Sheet for Healthcare Providers:  IncredibleEmployment.be  This test is no t yet approved or cleared by the [place] FDA and  has been authorized for detection and/or diagnosis of SARS-CoV-2 by FDA under an Emergency Use Authorization (EUA). This EUA will remain  in effect (meaning this test can be used) for the duration of the COVID-19 declaration under Section  564(b)(1) of the Act, 21 U.S.C.section 360bbb-3(b)(1), unless the authorization is terminated  or revoked sooner.       Influenza A by PCR NEGATIVE NEGATIVE Final   Influenza B by PCR NEGATIVE NEGATIVE Final    Comment: (NOTE) The Xpert Xpress SARS-CoV-2/FLU/RSV plus assay is intended as an aid in the diagnosis of influenza from Nasopharyngeal swab specimens and should not be used as a sole basis for treatment. Nasal washings and aspirates are unacceptable for Xpert Xpress SARS-CoV-2/FLU/RSV testing.  Fact Sheet for Patients: EntrepreneurPulse.com.au  Fact Sheet for Healthcare Providers: IncredibleEmployment.be  This test is not yet approved or cleared by the [place] FDA and has been authorized for detection and/or diagnosis of SARS-CoV-2 by FDA under an Emergency Use Authorization (EUA). This EUA will remain in effect (meaning this test can be used) for the duration of the COVID-19 declaration under Section 564(b)(1) of the Act, 21 U.S.C. section 360bbb-3(b)(1), unless the authorization is terminated or revoked.  Performed at [facility], [place] [address] [place], [place] [address]   Blood Culture (routine x 2)     Status: None   Collection Time: 11/04/20  4:31 PM   Specimen: BLOOD  Result Value Ref Range Status   Specimen Description   Final    BLOOD RIGHT ANTECUBITAL Performed at [place] [address], [place], [place] [address]    Special Requests   Final    BOTTLES DRAWN AEROBIC AND ANAEROBIC Blood Culture results may not be optimal due to an inadequate volume of blood received in culture bottles Performed at [place] [address] [place], [place] [address]    Culture   Final    NO GROWTH 5 DAYS Performed at [facility], [place] [address], [place], [place] [address]    Report Status 11/09/2020 FINAL  Final  Blood Culture (routine x 2)     Status:  None   Collection Time: 11/04/20  4:36 PM   Specimen: BLOOD RIGHT ARM  Result Value Ref Range Status   Specimen Description  Final    BLOOD RIGHT ARM Performed at [facility], [place] [address], [place], [place] [address]    Special Requests   Final    BOTTLES DRAWN AEROBIC AND ANAEROBIC Blood Culture results may not be optimal due to an inadequate volume of blood received in culture bottles Performed at [place] [address], [place], [place] [address]    Culture   Final    NO GROWTH 5 DAYS Performed at [facility], [place] [address] [place], [place] [address]    Report Status 11/09/2020 FINAL  Final  MRSA PCR Screening     Status: None   Collection Time: 11/05/20  9:00 AM   Specimen: Nasopharyngeal  Result Value Ref Range Status   MRSA by PCR NEGATIVE NEGATIVE Final    Comment:        The GeneXpert MRSA Assay (FDA approved for NASAL specimens only), is one component of a comprehensive MRSA colonization surveillance program. It is not intended to diagnose MRSA infection nor to guide or monitor treatment for MRSA infections. Performed at [facility], [place] [address] [place], [place] [address]   Urine culture     Status: None   Collection Time: 11/05/20 12:46 PM   Specimen: In/Out Cath Urine  Result Value Ref Range Status   Specimen Description   Final    IN/OUT CATH URINE Performed at [facility], [place] [address], [place], [place] [address]    Special Requests   Final    NONE Performed at [facility], [place] [address], [place],  [address]    Culture   Final    NO GROWTH Performed at [facility], [place] [address] [place],  [address]    Report Status 11/06/2020 FINAL  Final       Radiology Studies: No results found.   Scheduled Meds:  Chlorhexidine Gluconate Cloth  6 each Topical Daily   dorzolamide-timolol  1 drop Both Eyes BID   Gerhardt's butt  cream   Topical Daily   latanoprost  1 drop Both Eyes QHS   losartan  25 mg Oral Daily   mouth rinse  15 mL Mouth Rinse BID   polyethylene glycol  17 g Oral Daily   Continuous Infusions:  ceFEPime (MAXIPIME) IV 2 g (11/10/20 0345)   metronidazole 500 mg (11/10/20 1131)     LOS: 6 days    Flora Lipps, MD [facility]  If 7PM-7AM, please contact night-coverage [email or web address] 11/10/2020, 1:54 PM

## 2020-11-10 NOTE — Plan of Care (Signed)
  Problem: Education: Goal: Knowledge of General Education information will improve Description: Including pain rating scale, medication(s)/side effects and non-pharmacologic comfort measures Outcome: Progressing   Problem: Clinical Measurements: Goal: Will remain free from infection Outcome: Progressing   Problem: Coping: Goal: Level of anxiety will decrease Outcome: Progressing   Problem: Elimination: Goal: Will not experience complications related to bowel motility Outcome: Progressing   Problem: Safety: Goal: Ability to remain free from injury will improve Outcome: Progressing   Problem: Skin Integrity: Goal: Risk for impaired skin integrity will decrease Outcome: Progressing

## 2020-11-11 LAB — COMPREHENSIVE METABOLIC PANEL
ALT: 33 U/L (ref 0–44)
AST: 31 U/L (ref 15–41)
Albumin: 2.6 g/dL — ABNORMAL LOW (ref 3.5–5.0)
Alkaline Phosphatase: 50 U/L (ref 38–126)
Anion gap: 6 (ref 5–15)
BUN: 10 mg/dL (ref 8–23)
CO2: 25 mmol/L (ref 22–32)
Calcium: 8.9 mg/dL (ref 8.9–10.3)
Chloride: 110 mmol/L (ref 98–111)
Creatinine, Ser: 0.62 mg/dL (ref 0.44–1.00)
GFR, Estimated: >60 mL/min (ref >60)
Glucose, Bld: 122 mg/dL — ABNORMAL HIGH (ref 70–99)
Potassium: 3.8 mmol/L (ref 3.5–5.1)
Sodium: 141 mmol/L (ref 135–145)
Total Bilirubin: 0.4 mg/dL (ref 0.3–1.2)
Total Protein: 5.5 g/dL — ABNORMAL LOW (ref 6.5–8.1)

## 2020-11-11 MED ORDER — POLYETHYLENE GLYCOL 3350 17 G PO PACK: 17.0000 g | PACK | Freq: Every day | ORAL | 0 refills | Status: DC

## 2020-11-11 MED ORDER — AMOXICILLIN-POT CLAVULANATE 875-125 MG PO TABS: 1.0000 | ORAL_TABLET | Freq: Two times a day (BID) | ORAL | 0 refills | Status: AC

## 2020-11-11 NOTE — TOC Transition Note (Signed)
Transition of Care [facility]) - CM/SW Discharge Note   Patient Details  Name: Paula Bentley MRN: [id number] Date of Birth: 1941-05-30  Transition of [facility]) CM/SW Contact:  Illene Regulus, LCSW Phone Number: 11/11/2020, 11:00 AM   Clinical Narrative:    CSW spoke with Patient about recent PT visit, Paitent reported that it went well. Patient agreed to [facility] services, CSW provided choice. Patient will be d/c with [facility] with [facility]. Patient asked for bedside commode, tub bench, and a rolling walker with a seat. CSW contacted [facility] for DME and spoke with Mardene Celeste. DME will be shipped to patient home upon d/c. Patient reported she lives with her sister Lelon Frohlich who will be transporting her home from the hospital. No further needs, TOC sign Off.    Final next level of care: [place] Barriers to Discharge: No Barriers Identified   Patient Goals and CMS Choice Patient states their goals for this hospitalization and ongoing recovery are:: Going home with Home health CMS Medicare.gov Compare Post Acute Care list provided to:: Patient Choice offered to / list presented to : Patient, Sibling  Discharge Placement                       Discharge Plan and Services   Discharge Planning Services: CM Consult            DME Arranged: Bedside commode, Walker rolling with seat, Tub bench DME Agency: [employer] Date DME Agency Contacted: 11/11/20 Time DME Agency Contacted: 1517 Representative spoke with at DME Agency: [place]: PT [place]: [place] Date [place]: 11/11/20 Time [place]: 1005 Representative spoke with at [place]: [place] Determinants of Health ([place]) Interventions     Readmission Risk Interventions No flowsheet data found.

## 2020-11-11 NOTE — Discharge Summary (Signed)
Physician Discharge Summary  Paula Bentley EHM:[id number] DOB: September 17, 1940 DOA: 11/04/2020  PCP: Lucianne Lei, MD  Admit date: 11/04/2020 Discharge date: 11/11/2020  Admitted From: Home  Discharge disposition: Home  Recommendations for Outpatient Follow-Up:   Follow up with your primary care provider in one week.  Check CBC, CMP, magnesium in the next visit Follow-up with Dr. Barron Schmid gastroenterology in 1 month for MRI of the liver and liver follow-up   Discharge Diagnosis:   Principal Problem:   Sepsis ([place]) Active Problems:   History of pulmonary embolism   Hypertension   Elevated LFTs   Discharge Condition: Improved.  Diet recommendation: Low sodium, heart healthy.    Wound care: None.  Code status: Full.   History of Present Illness:   Patient is 80 year old female with history of hypertension, glaucoma, pulmonary embolism on Xarelto presented to hospital with nausea, vomiting chills and abdominal pain for 3 days with decreased p.o. tolerance.  In the ED, patient was noted to have right upper quadrant tenderness though she had history of cholecystectomy in the past.  She was noted to be febrile in the ED with hypotension.  Chest x-ray did not show any pneumonia.  LFTs were elevated including total bilirubin.  Lipase was normal.  CT scan of the abdomen and pelvis showed air in the biliary tree with dilated CBD.  Patient was then admitted to hospital for septic shock likely secondary to biliary source of infection.   Hospital Course:   Following conditions were addressed during hospitalization as listed below,  Septic shock Secondary to biliary infection.  Has resolved at this time.  Received IV cefepime and Flagyl for cholangitis.  Status post ERCP with removal of stones.  Will need for total of 10 days of antibiotic.  Will change to PO Augmentin on discharge.   Cholangitis/possible choledocholithiasis     ERCP showed biliary dilatation with  pneumobilia and filling defect.  Underwent ERCP and stones were removed.     Liver lesion MRI also showed 4.4 x 2.4 cm bilobed lesion in the posterior left liver, features are nonspecific.  Likely to be a hemangioma.  Recommendation is repetition of MRI in a month with GI.   History of PE Patient is on Xarelto at home.  Was on heparin drip initially but was on hold due to thrombocytopenia which seems to be mild.  No evidence of bleeding.  Will resume Xarelto on discharge.   History of hypertension:  On losartan at home.  Will resume.   Hypokalemia Improved after replacement.   Thrombocytopenia Likely secondary to sepsis. Will need to follow up as outpatient. On xarelto.     Glaucoma    On latanoprost and Cosopt at home.   Bradycardia Has improved.  Sinus bradycardia.  Disposition.  At this time, patient is stable for disposition home with outpatient PCP and GI follow up.   Medical Consultants:   GI IR  Procedures:    ERCP with removal of bile duct stone/.sphincterotomy and balloon extraction Subjective:   Today, patient was seen and examined. Denies pain, fever, chills or rigor. Tolerating po well.  Discharge Exam:   Vitals:   11/10/20 2006 11/11/20 0438  BP: 129/80 124/66  Pulse: 68 64  Resp: 18 18  Temp: 97.6 F (36.4 C) 98.2 F (36.8 C)  SpO2: 100% 97%   Vitals:   11/10/20 1328 11/10/20 2006 11/11/20 0438 11/11/20 0440  BP: 123/76 129/80 124/66   Pulse: 77 68 64   Resp: 16  18 18   Temp: 98.7 F (37.1 C) 97.6 F (36.4 C) 98.2 F (36.8 C)   TempSrc: Oral     SpO2: 99% 100% 97%   Weight:    87.1 kg  Height:        General: Alert awake, not in obvious distress, obese HENT: pupils equally reacting to light,  No scleral pallor or icterus noted. Oral mucosa is moist.  Chest:  Clear breath sounds.  Diminished breath sounds bilaterally. No crackles or wheezes.  CVS: S1 &S2 heard. No murmur.  Regular rate and rhythm. Abdomen: Soft, nontender,  nondistended.  Bowel sounds are heard.   Extremities: No cyanosis, clubbing or edema.  Peripheral pulses are palpable. Psych: Alert, awake and oriented, normal mood CNS:  No cranial nerve deficits.  Power equal in all extremities.   Skin: Warm and dry.  No rashes noted.  The results of significant diagnostics from this hospitalization (including imaging, microbiology, ancillary and laboratory) are listed below for reference.     Diagnostic Studies:   CT ABDOMEN PELVIS W CONTRAST  Addendum Date: 11/04/2020   ADDENDUM REPORT: 11/04/2020 20:28 ADDENDUM: There is also mild to moderate intrahepatic biliary ductal dilatation the lateral segment of the left lobe of the liver with ill-defined decreased density in that portion of the liver as well as a better defined heterogeneous area of low density measuring 5.7 x 3.6 cm on image number 12/2 and 2.3 cm in length on coronal image number 45/5. There is also a suggestion of a noncalcified distal common duct stone containing nitrogen in the distal common duct on image number 35/2, measuring 8 mm in diameter. Therefore, there should be additional impressions as follows: 1. Biliary ductal dilatation out of proportion for what is expected for an 80 year old patient with a previous cholecystectomy with a possible noncalcified distal common duct obstructing stone. Further evaluation with MRCP is recommended. 2. Ill-defined low density in the lateral segment of the left lobe of the liver where there is more intrahepatic biliary ductal dilatation as well as a 5.7 x 3.6 x 2.3 cm more discrete area of heterogeneous low density in the lateral segment of the left lobe of the liver. This could represent hepatic infection or malignancy. This could also be further evaluated at the time of MRCP. 3. The biliary air could be due to biliary infection, recent stone passage or previous sphincterotomy. Note: The changes in the report have been discussed with Dr. Marnette Burgess. Electronically  Signed   By: Claudie Revering M.D.   On: 11/04/2020 20:28   Result Date: 11/04/2020 CLINICAL DATA:  Intermittent diffuse abdominal pain for the past 5 days. Associated chills. Nausea and vomiting for the past 2 days. EXAM: CT ABDOMEN AND PELVIS WITH CONTRAST TECHNIQUE: Multidetector CT imaging of the abdomen and pelvis was performed using the standard protocol following bolus administration of intravenous contrast. CONTRAST:  151mL OMNIPAQUE IOHEXOL 300 MG/ML  SOLN COMPARISON:  None. FINDINGS: Lower chest: Moderate-sized hiatal hernia. Normal sized heart. Mild bibasilar linear atelectasis/scarring. Hepatobiliary: Air in the biliary tree. Surgically absent gallbladder. Dilated common duct measuring 2.1 cm in maximum diameter. No visible gallstones or mass. Pancreas: Unremarkable. No pancreatic ductal dilatation or surrounding inflammatory changes. Spleen: Normal in size without focal abnormality. Adrenals/Urinary Tract: Adrenal glands are unremarkable. Kidneys are normal, without renal calculi, focal lesion, or hydronephrosis. Bladder is unremarkable. Stomach/Bowel: Moderate-sized hiatal hernia. Extensive colonic diverticulosis without evidence of diverticulitis. Unremarkable small bowel and appendix. Vascular/Lymphatic: No significant vascular findings are present. No enlarged  abdominal or pelvic lymph nodes. Reproductive: Status post hysterectomy. No adnexal masses. Other: No abdominal wall hernia or abnormality. No abdominopelvic ascites. Musculoskeletal: Lower lumbar spine degenerative changes. IMPRESSION: 1. No acute abnormality. 2. Moderate-sized hiatal hernia. 3. Extensive colonic diverticulosis. Electronically Signed: By: Claudie Revering M.D. On: 11/04/2020 19:00   MR 3D Recon At Scanner  Result Date: 11/05/2020 CLINICAL DATA:  Jaundice. Nausea vomiting for 3 days. Intermittent upper abdominal pain. EXAM: MRI ABDOMEN WITHOUT AND WITH CONTRAST (INCLUDING MRCP) TECHNIQUE: Multiplanar multisequence MR imaging of  the abdomen was performed both before and after the administration of intravenous contrast. Heavily T2-weighted images of the biliary and pancreatic ducts were obtained, and three-dimensional MRCP images were rendered by post processing. CONTRAST:  80mL GADAVIST GADOBUTROL 1 MMOL/ML IV SOLN COMPARISON:  Abdomen/pelvis CT 11/04/2020 FINDINGS: Lower chest: Small to moderate hiatal hernia. Hepatobiliary: Liver measures 17.6 cm craniocaudal length, mildly enlarged. Periportal edema is associated with mild intrahepatic biliary duct dilatation, most prominently in the lateral segment left liver. Left hepatic duct is dilated up to 2 cm diameter with evidence of pneumobilia. Common duct measures 1.4 cm diameter in the porta hepatis with a possible tiny filling defect (axial T2 image 24/4). There is abrupt cut off of the common bile duct in the head of the pancreas just proximal to the ampulla. As noted on CT, a bilobed 4.4 x 2.4 cm lesion is identified in the posterior left liver (segment II). This lesion restricts diffusion shows subtle low level T2 hyperintensity and is similar in signal intensity to liver parenchyma on precontrast imaging. Postcontrast imaging is degraded by proximity to the heart generating substantial motion artifact in this portion of the liver, but the lesion does appear to heterogeneously enhance. Pancreas: No focal mass lesion. No dilatation of the main duct. No intraparenchymal cyst. No peripancreatic edema. Spleen:  No splenomegaly. No focal mass lesion. Adrenals/Urinary Tract: No adrenal nodule or mass. Tiny foci of hypoenhancement in both kidneys are too small to characterize, but likely benign. Stomach/Bowel: Small to moderate hiatal hernia. No small bowel or colonic dilatation within the visualized abdomen. Diverticular disease is seen diffusely in the visualize abdominal segments of the colon. Vascular/Lymphatic: No abdominal aortic aneurysm. Main portal vein and right portal vein are patent.  Patency of the left portal vein was better demonstrated on CT yesterday and although attenuated appears to be patent on today's study. There is no gastrohepatic or hepatoduodenal ligament lymphadenopathy. No retroperitoneal or mesenteric lymphadenopathy. Other: Trace perihepatic fluid with edema in Morison's pouch region in the hepatoduodenal ligament. Musculoskeletal: No focal suspicious marrow enhancement within the visualized bony anatomy. IMPRESSION: 1. Left greater than right intrahepatic biliary duct dilatation with pneumobilia. The common bile duct is dilated up to 14 mm diameter and there may be a punctate filling defect in the mid common bile duct. Abrupt cut off of the common bile duct in the head of the pancreas just proximal to the ampulla without obstructive etiology evident by MRI. 2. 4.4 x 2.4 cm bilobed lesion in the posterior left liver (segment II). Imaging features are nonspecific. Primary hepatic neoplasm or metastatic disease is a concern. 3. Periportal edema with edema in in the hepatoduodenal ligament. Trace fluid adjacent to the liver. 4. Patency of the left portal vein was better demonstrated on CT yesterday and appears to be patent on today's study. 5. Small to moderate hiatal hernia. 6. Colonic diverticulosis. Electronically Signed   By: Misty Stanley M.D.   On: 11/05/2020 07:56   DG  Chest Port 1 View  Result Date: 11/04/2020 CLINICAL DATA:  Questionable sepsis. Patient reports abdominal pain. EXAM: PORTABLE CHEST 1 VIEW COMPARISON:  None. FINDINGS: The cardiomediastinal contours are normal. Streaky atelectasis or scarring at the lung bases. Pulmonary vasculature is normal. No consolidation, pleural effusion, or pneumothorax. No acute osseous abnormalities are seen. IMPRESSION: Streaky atelectasis or scarring at the lung bases. No evidence of pneumonia. Electronically Signed   By: Keith Rake M.D.   On: 11/04/2020 18:57   MR ABDOMEN MRCP W WO CONTAST  Result Date:  11/05/2020 CLINICAL DATA:  Jaundice. Nausea vomiting for 3 days. Intermittent upper abdominal pain. EXAM: MRI ABDOMEN WITHOUT AND WITH CONTRAST (INCLUDING MRCP) TECHNIQUE: Multiplanar multisequence MR imaging of the abdomen was performed both before and after the administration of intravenous contrast. Heavily T2-weighted images of the biliary and pancreatic ducts were obtained, and three-dimensional MRCP images were rendered by post processing. CONTRAST:  7mL GADAVIST GADOBUTROL 1 MMOL/ML IV SOLN COMPARISON:  Abdomen/pelvis CT 11/04/2020 FINDINGS: Lower chest: Small to moderate hiatal hernia. Hepatobiliary: Liver measures 17.6 cm craniocaudal length, mildly enlarged. Periportal edema is associated with mild intrahepatic biliary duct dilatation, most prominently in the lateral segment left liver. Left hepatic duct is dilated up to 2 cm diameter with evidence of pneumobilia. Common duct measures 1.4 cm diameter in the porta hepatis with a possible tiny filling defect (axial T2 image 24/4). There is abrupt cut off of the common bile duct in the head of the pancreas just proximal to the ampulla. As noted on CT, a bilobed 4.4 x 2.4 cm lesion is identified in the posterior left liver (segment II). This lesion restricts diffusion shows subtle low level T2 hyperintensity and is similar in signal intensity to liver parenchyma on precontrast imaging. Postcontrast imaging is degraded by proximity to the heart generating substantial motion artifact in this portion of the liver, but the lesion does appear to heterogeneously enhance. Pancreas: No focal mass lesion. No dilatation of the main duct. No intraparenchymal cyst. No peripancreatic edema. Spleen:  No splenomegaly. No focal mass lesion. Adrenals/Urinary Tract: No adrenal nodule or mass. Tiny foci of hypoenhancement in both kidneys are too small to characterize, but likely benign. Stomach/Bowel: Small to moderate hiatal hernia. No small bowel or colonic dilatation within  the visualized abdomen. Diverticular disease is seen diffusely in the visualize abdominal segments of the colon. Vascular/Lymphatic: No abdominal aortic aneurysm. Main portal vein and right portal vein are patent. Patency of the left portal vein was better demonstrated on CT yesterday and although attenuated appears to be patent on today's study. There is no gastrohepatic or hepatoduodenal ligament lymphadenopathy. No retroperitoneal or mesenteric lymphadenopathy. Other: Trace perihepatic fluid with edema in Morison's pouch region in the hepatoduodenal ligament. Musculoskeletal: No focal suspicious marrow enhancement within the visualized bony anatomy. IMPRESSION: 1. Left greater than right intrahepatic biliary duct dilatation with pneumobilia. The common bile duct is dilated up to 14 mm diameter and there may be a punctate filling defect in the mid common bile duct. Abrupt cut off of the common bile duct in the head of the pancreas just proximal to the ampulla without obstructive etiology evident by MRI. 2. 4.4 x 2.4 cm bilobed lesion in the posterior left liver (segment II). Imaging features are nonspecific. Primary hepatic neoplasm or metastatic disease is a concern. 3. Periportal edema with edema in in the hepatoduodenal ligament. Trace fluid adjacent to the liver. 4. Patency of the left portal vein was better demonstrated on CT yesterday and appears  to be patent on today's study. 5. Small to moderate hiatal hernia. 6. Colonic diverticulosis. Electronically Signed   By: Misty Stanley M.D.   On: 11/05/2020 07:56   US Abdomen Limited RUQ (LIVER/GB)  Result Date: 11/04/2020 CLINICAL DATA:  Jaundice, history of cholecystectomy EXAM: ULTRASOUND ABDOMEN LIMITED RIGHT UPPER QUADRANT COMPARISON:  11/04/2020 CT abdomen/pelvis FINDINGS: Gallbladder: Surgically absent. Common bile duct: Diameter: 20 mm. No stones in the visualized bile ducts, noting nonvisualization of the distal CBD due to overlying bowel gas. Liver:  No focal lesion identified. Within normal limits in parenchymal echogenicity. Portal vein is patent on color Doppler imaging with normal direction of blood flow towards the liver. Other: None. IMPRESSION: Prominent extrahepatic biliary ductal dilatation with CBD diameter 20 mm. Suggest MRI abdomen with MRCP without and with IV contrast for further evaluation to exclude obstructing choledocholithiasis. Electronically Signed   By: Ilona Sorrel M.D.   On: 11/04/2020 20:33     Labs:   Basic Metabolic Panel: Recent Labs  Lab 11/06/20 1511 11/06/20 1617 11/07/20 0302 11/08/20 0244 11/09/20 0507 11/11/20 0514  NA  --  137 137 138 139 141  K  --  4.8 3.8 3.7 3.6 3.8  CL  --  111 110 110 109 110  CO2  --  16* 19* 22 21* 25  GLUCOSE  --  140* 138* 129* 99 122*  BUN  --  19 20 22 15 10   CREATININE  --  0.70 0.66 0.72 0.72 0.62  CALCIUM  --  8.8* 8.9 9.2 8.9 8.9  MG 1.9  --  1.7  --   --   --    GFR Estimated Creatinine Clearance: 58.7 mL/min (by C-G formula based on SCr of 0.62 mg/dL). Liver Function Tests: Recent Labs  Lab 11/06/20 1617 11/07/20 0302 11/08/20 0244 11/09/20 0507 11/11/20 0514  AST 45* 29 26 26 31   ALT 95* 79* 57* 46* 33  ALKPHOS 81 68 64 55 50  BILITOT 1.5* 1.5* 1.0 1.1 0.4  PROT 6.3* 5.8* 5.7* 5.6* 5.5*  ALBUMIN 2.9* 2.7* 2.8* 2.7* 2.6*   Recent Labs  Lab 11/04/20 1515  LIPASE 18   No results for input(s): AMMONIA in the last 168 hours. Coagulation profile Recent Labs  Lab 11/04/20 1631  INR 1.8*    CBC: Recent Labs  Lab 11/04/20 1515 11/05/20 0500 11/06/20 0257 11/07/20 0302 11/08/20 0244 11/09/20 0507  WBC 5.1 17.4* 11.2* 10.1 10.5 7.9  NEUTROABS 4.9  --   --   --   --   --   HGB 13.8 11.2* 11.4* 12.4 12.2 12.0  HCT 40.4 32.9* 34.5* 37.3 35.9* 36.4  MCV 92.7 94.0 95.8 94.0 92.3 96.0  PLT 126* 109* 114* 128* 127* 119*   Cardiac Enzymes: No results for input(s): CKTOTAL, CKMB, CKMBINDEX, TROPONINI in the last 168 hours. BNP: Invalid  input(s): POCBNP CBG: No results for input(s): GLUCAP in the last 168 hours. D-Dimer No results for input(s): DDIMER in the last 72 hours. Hgb A1c No results for input(s): HGBA1C in the last 72 hours. Lipid Profile No results for input(s): CHOL, HDL, LDLCALC, TRIG, CHOLHDL, LDLDIRECT in the last 72 hours. Thyroid function studies No results for input(s): TSH, T4TOTAL, T3FREE, THYROIDAB in the last 72 hours.  Invalid input(s): FREET3 Anemia work up No results for input(s): VITAMINB12, FOLATE, FERRITIN, TIBC, IRON, RETICCTPCT in the last 72 hours. Microbiology Recent Results (from the past 240 hour(s))  Resp Panel by RT-PCR (Flu A&B, Covid) Nasopharyngeal Swab  Status: None   Collection Time: 11/04/20  4:31 PM   Specimen: Nasopharyngeal Swab; Nasopharyngeal(NP) swabs in vial transport medium  Result Value Ref Range Status   SARS Coronavirus 2 by RT PCR NEGATIVE NEGATIVE Final    Comment: (NOTE) SARS-CoV-2 target nucleic acids are NOT DETECTED.  The SARS-CoV-2 RNA is generally detectable in upper respiratory specimens during the acute phase of infection. The lowest concentration of SARS-CoV-2 viral copies this assay can detect is 138 copies/mL. A negative result does not preclude SARS-Cov-2 infection and should not be used as the sole basis for treatment or other patient management decisions. A negative result may occur with  improper specimen collection/handling, submission of specimen other than nasopharyngeal swab, presence of viral mutation(s) within the areas targeted by this assay, and inadequate number of viral copies(<138 copies/mL). A negative result must be combined with clinical observations, patient history, and epidemiological information. The expected result is Negative.  Fact Sheet for Patients:  EntrepreneurPulse.com.au  Fact Sheet for Healthcare Providers:  IncredibleEmployment.be  This test is no t yet approved or  cleared by the [place] FDA and  has been authorized for detection and/or diagnosis of SARS-CoV-2 by FDA under an Emergency Use Authorization (EUA). This EUA will remain  in effect (meaning this test can be used) for the duration of the COVID-19 declaration under Section 564(b)(1) of the Act, 21 U.S.C.section 360bbb-3(b)(1), unless the authorization is terminated  or revoked sooner.       Influenza A by PCR NEGATIVE NEGATIVE Final   Influenza B by PCR NEGATIVE NEGATIVE Final    Comment: (NOTE) The Xpert Xpress SARS-CoV-2/FLU/RSV plus assay is intended as an aid in the diagnosis of influenza from Nasopharyngeal swab specimens and should not be used as a sole basis for treatment. Nasal washings and aspirates are unacceptable for Xpert Xpress SARS-CoV-2/FLU/RSV testing.  Fact Sheet for Patients: EntrepreneurPulse.com.au  Fact Sheet for Healthcare Providers: IncredibleEmployment.be  This test is not yet approved or cleared by the [place] FDA and has been authorized for detection and/or diagnosis of SARS-CoV-2 by FDA under an Emergency Use Authorization (EUA). This EUA will remain in effect (meaning this test can be used) for the duration of the COVID-19 declaration under Section 564(b)(1) of the Act, 21 U.S.C. section 360bbb-3(b)(1), unless the authorization is terminated or revoked.  Performed at [facility], [place] [address], [place], [place] [address]   Blood Culture (routine x 2)     Status: None   Collection Time: 11/04/20  4:31 PM   Specimen: BLOOD  Result Value Ref Range Status   Specimen Description   Final    BLOOD RIGHT ANTECUBITAL Performed at [facility] [address] [place], [place] [address]    Special Requests   Final    BOTTLES DRAWN AEROBIC AND ANAEROBIC Blood Culture results may not be optimal due to an inadequate volume of blood received in culture bottles Performed at  [facility] [address] [place], [place] [address]    Culture   Final    NO GROWTH 5 DAYS Performed at [facility], [place] [address] [place],  [address]    Report Status 11/09/2020 FINAL  Final  Blood Culture (routine x 2)     Status: None   Collection Time: 11/04/20  4:36 PM   Specimen: BLOOD RIGHT ARM  Result Value Ref Range Status   Specimen Description   Final    BLOOD RIGHT ARM Performed at [facility], [place] [address] [place], [place] [address]    Special Requests   Final    BOTTLES DRAWN AEROBIC AND ANAEROBIC Blood Culture results may not be optimal due to an inadequate volume of blood received in culture bottles Performed at [place] [address] [place], [place] [address]    Culture   Final    NO GROWTH 5 DAYS Performed at [facility], [place] [address], [place], [place] [address]    Report Status 11/09/2020 FINAL  Final  MRSA PCR Screening     Status: None   Collection Time: 11/05/20  9:00 AM   Specimen: Nasopharyngeal  Result Value Ref Range Status   MRSA by PCR NEGATIVE NEGATIVE Final    Comment:        The GeneXpert MRSA Assay (FDA approved for NASAL specimens only), is one component of a comprehensive MRSA colonization surveillance program. It is not intended to diagnose MRSA infection nor to guide or monitor treatment for MRSA infections. Performed at [facility], [place] [address], [place], [place] [address]   Urine culture     Status: None   Collection Time: 11/05/20 12:46 PM   Specimen: In/Out Cath Urine  Result Value Ref Range Status   Specimen Description   Final    IN/OUT CATH URINE Performed at [facility], [place] [address] [place], [place] [address]    Special Requests   Final    NONE Performed at [facility], [place] [address] [place], [place] [address]    Culture   Final    NO GROWTH Performed at [facility], [place] [address], [place], [place] [address]    Report Status 11/06/2020 FINAL  Final     Discharge Instructions:   Discharge Instructions     Call MD for:  severe uncontrolled pain   Complete by: As directed    Call MD for:  temperature >100.4   Complete by: As directed    Diet - low sodium heart healthy   Complete by: As directed    Soft diet.   Discharge instructions   Complete by: As directed    Follow-up with your primary care physician in 1 week.  Check blood work at that time including liver function test for complete the course of antibiotic.  Follow-up with Dr. Barron Schmid gastroenterology in 1 month for MRI of the liver and liver follow-up.   Increase activity slowly   Complete by: As directed       Allergies as of 11/11/2020   No Known Allergies      Medication List     TAKE these medications    acetaminophen 325 MG tablet Commonly known as: TYLENOL Take 650 mg by mouth every 6 (six) hours as needed for mild pain, fever or headache. What changed: Another medication with the same name was removed. Continue taking this medication, and follow the directions you see here.   amoxicillin-clavulanate 875-125 MG tablet Commonly known as: Augmentin Take 1 tablet by mouth 2 (two) times daily for 4 days.   dorzolamide-timolol 22.3-6.8 MG/ML ophthalmic solution Commonly known as: COSOPT Place 1 drop into both eyes 2 (two) times daily.   latanoprost 0.005 % ophthalmic solution Commonly known as: XALATAN Place 1 drop into both eyes at bedtime.   losartan 25 MG tablet Commonly known as: COZAAR Take 25 mg by mouth daily.   polyethylene glycol 17 g packet Commonly known  as: MIRALAX / GLYCOLAX Take 17 g by mouth daily.   trolamine salicylate 10 % cream Commonly known as: ASPERCREME Apply 1 application topically as needed for muscle pain.   Xarelto 20 MG Tabs tablet Generic drug: rivaroxaban Take 20 mg by mouth daily.               Durable  Medical Equipment  (From admission, onward)           Start     Ordered   11/11/20 0908  For home use only DME Walker rolling  Once       Question Answer Comment  Walker: With 5 Inch Wheels   Patient needs a walker to treat with the following condition Ambulatory dysfunction      11/11/20 0908            Follow-up Information     Clarene Essex, MD. Schedule an appointment as soon as possible for a visit in 1 month(s).   Specialty: Gastroenterology Why: MRI of liver, liver lesion followup, recent ERCP Contact information: [address] [place] [address] [phone]         Lucianne Lei, MD Follow up in 1 week(s).   Specialty: Family Medicine Why: blood work and regular followup Contact information: [address] [place] [address] [phone]                  Time coordinating discharge: 39 minutes  Signed:  Ragnar Waas  [facility] 11/11/2020, 9:08 AM

## 2020-11-11 NOTE — Care Management Important Message (Signed)
Important Message  Patient Details IM Letter given to the Patient. Name: Paula Bentley MRN: [id number] Date of Birth: 31-Jul-1940   Medicare Important Message Given:  Yes     Kerin Salen 11/11/2020, 11:00 AM

## 2020-11-13 DIAGNOSIS — I10 Essential (primary) hypertension: Secondary | ICD-10-CM | POA: Diagnosis not present

## 2020-11-13 DIAGNOSIS — Z86711 Personal history of pulmonary embolism: Secondary | ICD-10-CM | POA: Diagnosis not present

## 2020-11-13 DIAGNOSIS — A419 Sepsis, unspecified organism: Secondary | ICD-10-CM | POA: Diagnosis not present

## 2020-11-18 DIAGNOSIS — K8071 Calculus of gallbladder and bile duct without cholecystitis with obstruction: Secondary | ICD-10-CM | POA: Diagnosis not present

## 2020-11-18 DIAGNOSIS — E876 Hypokalemia: Secondary | ICD-10-CM | POA: Diagnosis not present

## 2020-11-18 DIAGNOSIS — I82503 Chronic embolism and thrombosis of unspecified deep veins of lower extremity, bilateral: Secondary | ICD-10-CM | POA: Diagnosis not present

## 2020-11-18 DIAGNOSIS — R6889 Other general symptoms and signs: Secondary | ICD-10-CM | POA: Diagnosis not present

## 2020-11-18 DIAGNOSIS — I10 Essential (primary) hypertension: Secondary | ICD-10-CM | POA: Diagnosis not present

## 2020-12-09 DIAGNOSIS — R17 Unspecified jaundice: Secondary | ICD-10-CM | POA: Diagnosis not present

## 2020-12-09 DIAGNOSIS — I10 Essential (primary) hypertension: Secondary | ICD-10-CM | POA: Diagnosis not present

## 2020-12-09 DIAGNOSIS — A419 Sepsis, unspecified organism: Secondary | ICD-10-CM | POA: Diagnosis not present

## 2020-12-09 DIAGNOSIS — Z86711 Personal history of pulmonary embolism: Secondary | ICD-10-CM | POA: Diagnosis not present

## 2020-12-09 DIAGNOSIS — R7989 Other specified abnormal findings of blood chemistry: Secondary | ICD-10-CM | POA: Diagnosis not present

## 2020-12-12 ENCOUNTER — Other Ambulatory Visit: Payer: Self-pay | Admitting: Gastroenterology

## 2020-12-12 ENCOUNTER — Other Ambulatory Visit: Payer: Self-pay

## 2020-12-12 DIAGNOSIS — R932 Abnormal findings on diagnostic imaging of liver and biliary tract: Secondary | ICD-10-CM | POA: Diagnosis not present

## 2020-12-13 ENCOUNTER — Other Ambulatory Visit: Payer: Medicare Other

## 2020-12-23 ENCOUNTER — Other Ambulatory Visit: Payer: Self-pay

## 2020-12-23 ENCOUNTER — Ambulatory Visit
Admission: RE | Admit: 2020-12-23 | Discharge: 2020-12-23 | Disposition: A | Discharge status: Home / Self Care | Payer: Medicare Other | Source: Ambulatory Visit | Attending: Gastroenterology | Admitting: Gastroenterology

## 2020-12-23 DIAGNOSIS — R17 Unspecified jaundice: Secondary | ICD-10-CM | POA: Diagnosis not present

## 2020-12-23 DIAGNOSIS — K5730 Diverticulosis of large intestine without perforation or abscess without bleeding: Secondary | ICD-10-CM | POA: Diagnosis not present

## 2020-12-23 DIAGNOSIS — R932 Abnormal findings on diagnostic imaging of liver and biliary tract: Secondary | ICD-10-CM

## 2020-12-23 IMAGING — MR MR ABDOMEN WO/W CM
10 of 19 series · 24 of 48 positions shown · IV contrast (5 GADAVIST)
Comparison: MR abdomen, [DATE], ERCP, [DATE]
COMPARISON: MR abdomen, [DATE], ERCP, [DATE]

Addendum:
CLINICAL DATA: Jaundice, nausea and vomiting, upper abdominal pain

EXAM:
MRI ABDOMEN WITHOUT AND WITH CONTRAST
TECHNIQUE: Multiplanar multisequence MR imaging of the abdomen was performed
both before and after the administration of intravenous contrast.
CONTRAST:  5mL GADAVIST GADOBUTROL 1 MMOL/ML IV SOLN

[Series 4: axial haste · axial · 6.0mm · 0.68mm/px · z∈[-95,+228]mm · 2 of 50 slices shown]
[im 1/50]
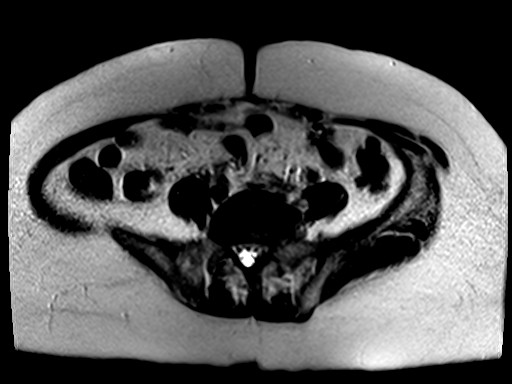
[im 50/50]
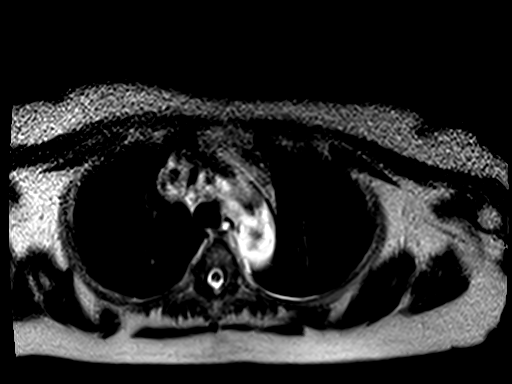

[Series 6: T1 · axial · 6.0mm · 0.68mm/px · z∈[-93,+217]mm · 4 of 96 slices shown]
[im 1/96]
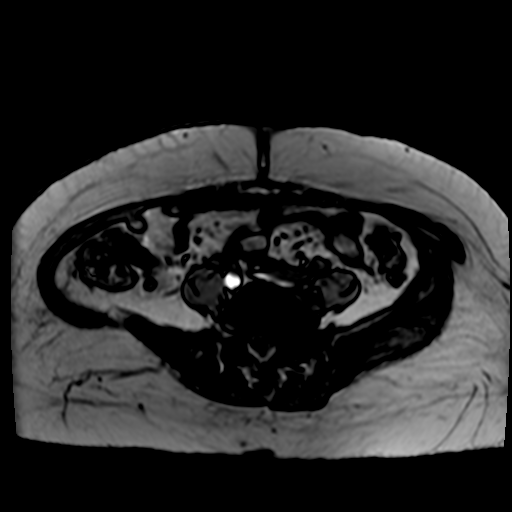
[im 32/96]
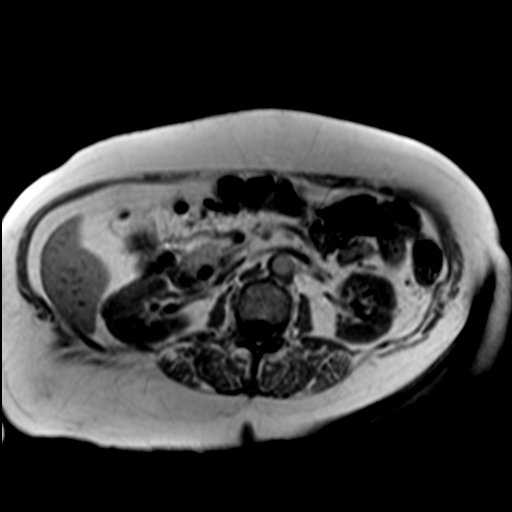
[im 64/96]
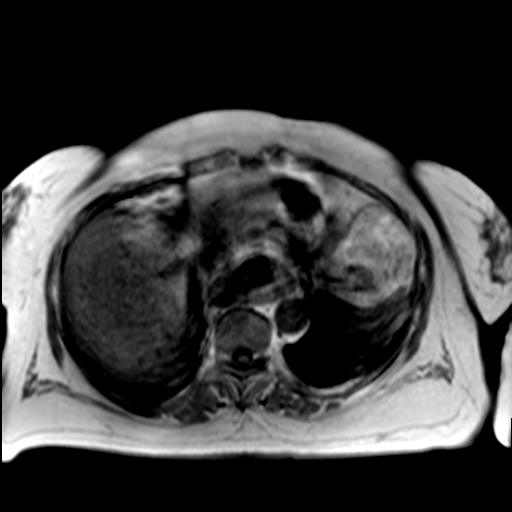
[im 96/96]
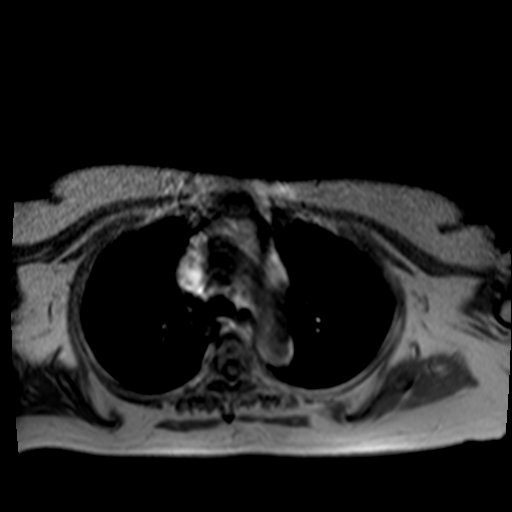

[Series 8: cor haste · coronal · 5.0mm · 0.68mm/px · 1 of 45 slices shown]
[im 1/45]
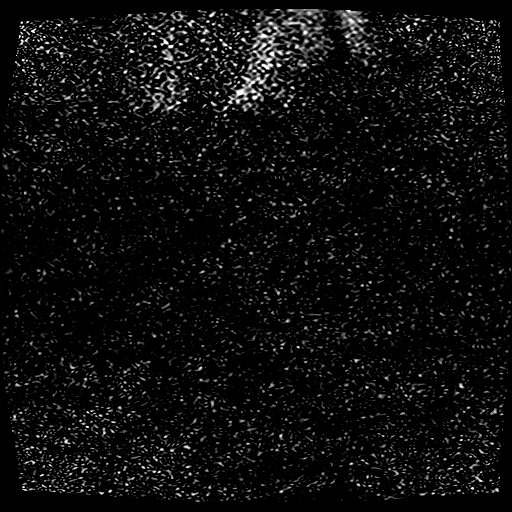

[Series 9: ep2d_diff_b50_500_800_p2_trig · axial · 6.0mm · 1.82mm/px · z∈[-101,+215]mm · 4 of 135 slices shown]
[im 1/135]
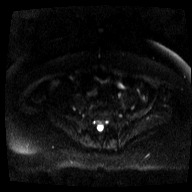
[im 45/135]
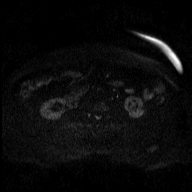
[im 90/135]
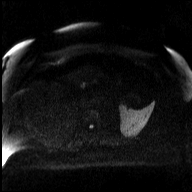
[im 135/135]
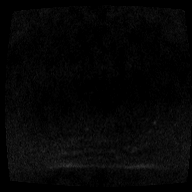

[Series 10: ep2d_diff_b50_500_800_p2_trig_adc · axial · 6.0mm · 1.82mm/px · 1 of 45 slices shown]
[im 1/45]
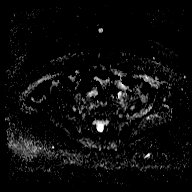

[Series 11: T2 fat-sat · axial · 6.0mm · 1.09mm/px · 1 of 45 slices shown]
[im 1/45]
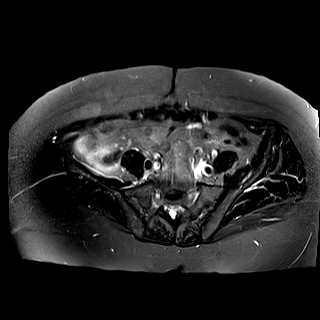

[Series 12: bSSFP · axial · 4.0mm · 0.68mm/px · z∈[-112,+196]mm · 2 of 78 slices shown]
[im 1/78]
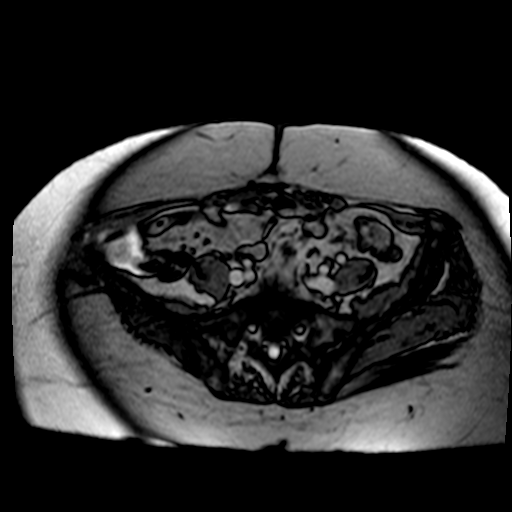
[im 78/78]
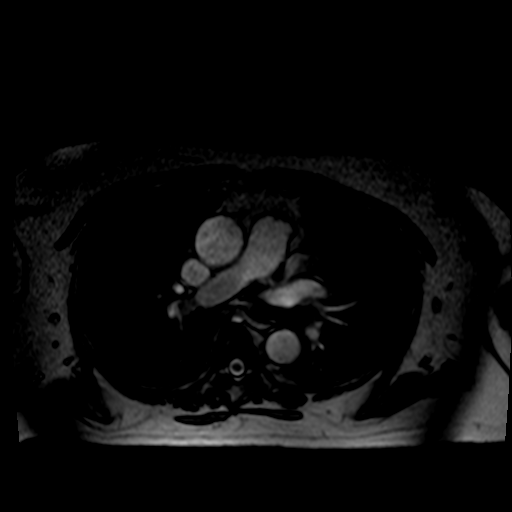

[Series 13: T1 dynamic · axial · non-contrast · 2.5mm · 0.74mm/px · z∈[-95,+183]mm · 3 of 112 slices shown]
[im 1/112]
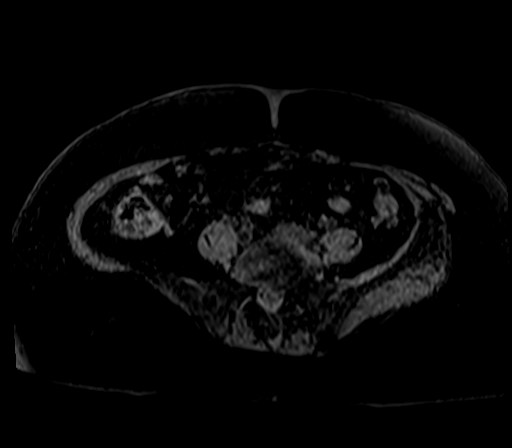
[im 56/112]
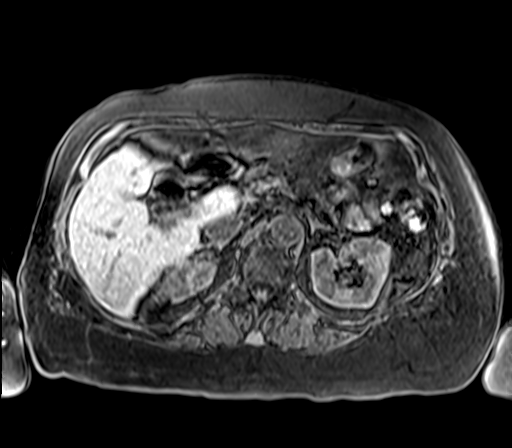
[im 112/112]
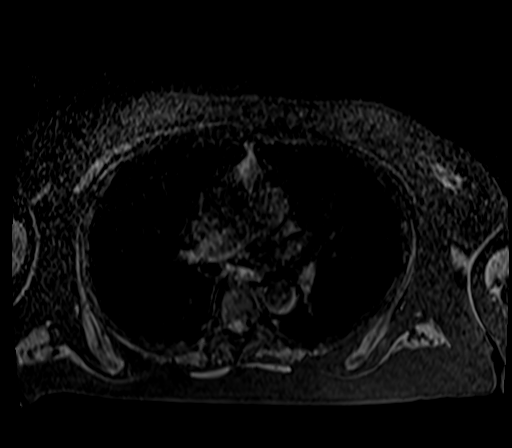

[Series 14: T1 dynamic post-contrast · axial · 2.5mm · 0.74mm/px · z∈[-95,+183]mm · 3 of 112 slices shown (1 of 2)]
[im 1/112]
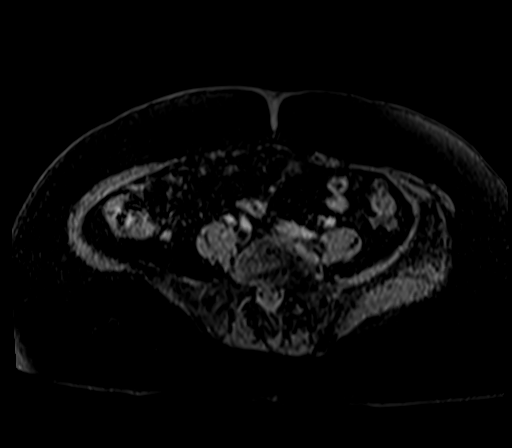
[im 56/112]
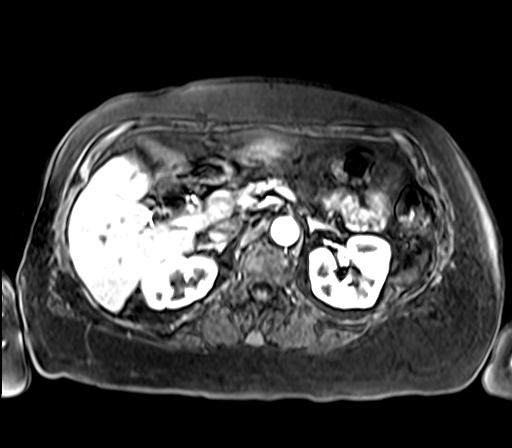
[im 112/112]
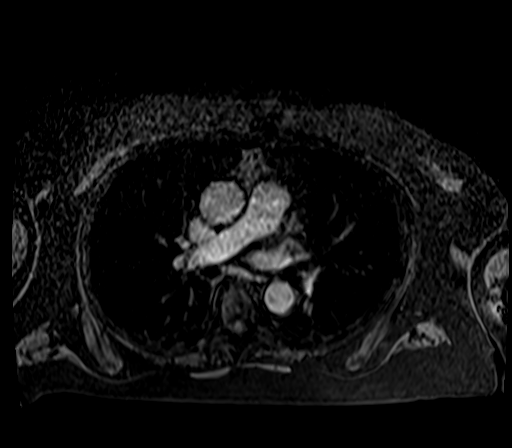

[Series 15: T1 dynamic post-contrast · axial · 2.5mm · 0.74mm/px · z∈[-95,+183]mm · 3 of 112 slices shown (2 of 2)]
[im 1/112]
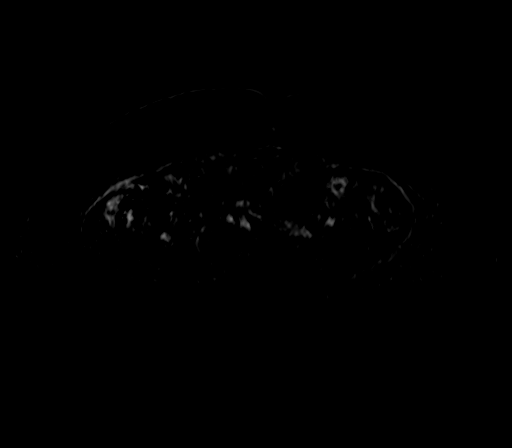
[im 56/112]
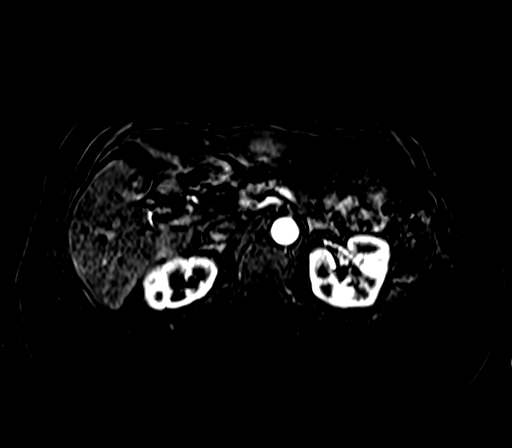
[im 112/112]
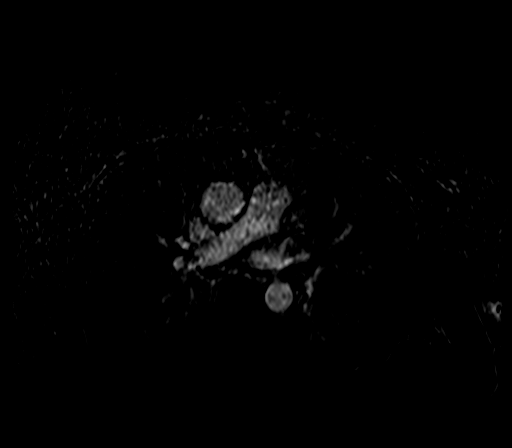

[24 of 48 positions shown; findings below may reference images not displayed]

FINDINGS: Lower chest: No acute findings. Small hiatal hernia, with
intrathoracic position of the gastric fundus.

Hepatobiliary: No mass or other parenchymal abnormality identified.
Status post cholecystectomy. Interval relief of previously seen
intrahepatic biliary ductal dilatation, with persistent prominence
of the central common bile duct, measuring up to 1.4 cm in caliber.
Redemonstrated pneumobilia.

Pancreas: No mass, inflammatory changes, or other parenchymal
abnormality identified. No pancreatic ductal dilatation.

Spleen:  Within normal limits in size and appearance.

Adrenals/Urinary Tract: No masses identified. No evidence of
hydronephrosis.

Stomach/Bowel: Colonic diverticulosis. Visualized portions within
the abdomen are otherwise unremarkable.

Vascular/Lymphatic: No pathologically enlarged lymph nodes
identified. No abdominal aortic aneurysm demonstrated.

Other:  None.

Musculoskeletal: No suspicious bone lesions identified.
IMPRESSION: 1. Interval relief of previously seen intrahepatic biliary ductal
dilatation, with persistent prominence of the central common bile
duct, measuring up to 1.4 cm in caliber. Previously noted ampullary
calculus is no longer seen following ERCP.
2. Status post cholecystectomy.
3. Redemonstrated pneumobilia.
4. Colonic diverticulosis.
5. Hiatal hernia.

ADDENDUM:
A previously described masslike lesion of the posterior left lobe of
the liver is difficult to appreciate on current examination owing to
size and breath/cardiac motion artifact, unchanged in size measuring
4.1 x 2.2 cm (series 14, image 37). Contrast enhancement
characteristics are likewise difficult to assess but this does
appear to demonstrate some degree of progressive, heterogeneous
contrast enhancement, with contrast enhancement at approximately
blood pool on 3 minutes imaging (series 21, image 38). This most
likely reflects a hemangioma incidental to patient's clinical
presentation. Consider additional surveillance at 6 months to ensure
stability.

*** End of Addendum ***
FINDINGS: Lower chest: No acute findings. Small hiatal hernia, with
intrathoracic position of the gastric fundus.

Hepatobiliary: No mass or other parenchymal abnormality identified.
Status post cholecystectomy. Interval relief of previously seen
intrahepatic biliary ductal dilatation, with persistent prominence
of the central common bile duct, measuring up to 1.4 cm in caliber.
Redemonstrated pneumobilia.

Pancreas: No mass, inflammatory changes, or other parenchymal
abnormality identified. No pancreatic ductal dilatation.

Spleen:  Within normal limits in size and appearance.

Adrenals/Urinary Tract: No masses identified. No evidence of
hydronephrosis.

Stomach/Bowel: Colonic diverticulosis. Visualized portions within
the abdomen are otherwise unremarkable.

Vascular/Lymphatic: No pathologically enlarged lymph nodes
identified. No abdominal aortic aneurysm demonstrated.

Other:  None.

Musculoskeletal: No suspicious bone lesions identified.
IMPRESSION: 1. Interval relief of previously seen intrahepatic biliary ductal
dilatation, with persistent prominence of the central common bile
duct, measuring up to 1.4 cm in caliber. Previously noted ampullary
calculus is no longer seen following ERCP.
2. Status post cholecystectomy.
3. Redemonstrated pneumobilia.
4. Colonic diverticulosis.
5. Hiatal hernia.

## 2020-12-23 MED ORDER — GADOBUTROL 1 MMOL/ML IV SOLN
5.0000 mL | Freq: Once | INTRAVENOUS | Status: AC | PRN
  Administered 2020-12-23: 5 mL via INTRAVENOUS

## 2021-01-20 DIAGNOSIS — E782 Mixed hyperlipidemia: Secondary | ICD-10-CM | POA: Diagnosis not present

## 2021-01-20 DIAGNOSIS — I10 Essential (primary) hypertension: Secondary | ICD-10-CM | POA: Diagnosis not present

## 2021-02-06 DIAGNOSIS — Z1231 Encounter for screening mammogram for malignant neoplasm of breast: Secondary | ICD-10-CM | POA: Diagnosis not present

## 2021-02-06 DIAGNOSIS — M85852 Other specified disorders of bone density and structure, left thigh: Secondary | ICD-10-CM | POA: Diagnosis not present

## 2021-02-06 DIAGNOSIS — Z780 Asymptomatic menopausal state: Secondary | ICD-10-CM | POA: Diagnosis not present

## 2021-02-06 DIAGNOSIS — M85851 Other specified disorders of bone density and structure, right thigh: Secondary | ICD-10-CM | POA: Diagnosis not present

## 2021-03-02 DIAGNOSIS — K8071 Calculus of gallbladder and bile duct without cholecystitis with obstruction: Secondary | ICD-10-CM | POA: Diagnosis not present

## 2021-03-02 DIAGNOSIS — M130 Polyarthritis, unspecified: Secondary | ICD-10-CM | POA: Diagnosis not present

## 2021-03-02 DIAGNOSIS — I2601 Septic pulmonary embolism with acute cor pulmonale: Secondary | ICD-10-CM | POA: Diagnosis not present

## 2021-03-02 DIAGNOSIS — I10 Essential (primary) hypertension: Secondary | ICD-10-CM | POA: Diagnosis not present

## 2021-03-02 DIAGNOSIS — E876 Hypokalemia: Secondary | ICD-10-CM | POA: Diagnosis not present

## 2021-03-02 DIAGNOSIS — E782 Mixed hyperlipidemia: Secondary | ICD-10-CM | POA: Diagnosis not present

## 2021-04-07 DIAGNOSIS — H25813 Combined forms of age-related cataract, bilateral: Secondary | ICD-10-CM | POA: Diagnosis not present

## 2021-04-07 DIAGNOSIS — H401131 Primary open-angle glaucoma, bilateral, mild stage: Secondary | ICD-10-CM | POA: Diagnosis not present

## 2021-04-07 DIAGNOSIS — H04123 Dry eye syndrome of bilateral lacrimal glands: Secondary | ICD-10-CM | POA: Diagnosis not present

## 2021-04-18 DIAGNOSIS — I10 Essential (primary) hypertension: Secondary | ICD-10-CM | POA: Diagnosis not present

## 2021-04-18 DIAGNOSIS — J9801 Acute bronchospasm: Secondary | ICD-10-CM | POA: Diagnosis not present

## 2021-04-18 DIAGNOSIS — M545 Low back pain, unspecified: Secondary | ICD-10-CM | POA: Diagnosis not present

## 2021-04-21 DIAGNOSIS — R070 Pain in throat: Secondary | ICD-10-CM | POA: Diagnosis not present

## 2021-04-21 DIAGNOSIS — R059 Cough, unspecified: Secondary | ICD-10-CM | POA: Diagnosis not present

## 2021-05-04 DIAGNOSIS — E782 Mixed hyperlipidemia: Secondary | ICD-10-CM | POA: Diagnosis not present

## 2021-05-04 DIAGNOSIS — A419 Sepsis, unspecified organism: Secondary | ICD-10-CM | POA: Diagnosis not present

## 2021-05-04 DIAGNOSIS — I10 Essential (primary) hypertension: Secondary | ICD-10-CM | POA: Diagnosis not present

## 2021-07-13 DIAGNOSIS — E782 Mixed hyperlipidemia: Secondary | ICD-10-CM | POA: Diagnosis not present

## 2021-07-13 DIAGNOSIS — I10 Essential (primary) hypertension: Secondary | ICD-10-CM | POA: Diagnosis not present

## 2021-08-07 ENCOUNTER — Other Ambulatory Visit: Payer: Self-pay | Admitting: Gastroenterology

## 2021-08-07 DIAGNOSIS — R932 Abnormal findings on diagnostic imaging of liver and biliary tract: Secondary | ICD-10-CM

## 2021-09-03 DIAGNOSIS — E729 Disorder of amino-acid metabolism, unspecified: Secondary | ICD-10-CM | POA: Diagnosis not present

## 2021-09-03 DIAGNOSIS — R7303 Prediabetes: Secondary | ICD-10-CM | POA: Diagnosis not present

## 2021-09-03 DIAGNOSIS — I10 Essential (primary) hypertension: Secondary | ICD-10-CM | POA: Diagnosis not present

## 2021-09-15 ENCOUNTER — Ambulatory Visit
Admission: RE | Admit: 2021-09-15 | Discharge: 2021-09-15 | Disposition: A | Discharge status: Home / Self Care | Payer: Medicare Other | Source: Ambulatory Visit | Attending: Gastroenterology | Admitting: Gastroenterology

## 2021-09-15 DIAGNOSIS — K449 Diaphragmatic hernia without obstruction or gangrene: Secondary | ICD-10-CM | POA: Diagnosis not present

## 2021-09-15 DIAGNOSIS — K5730 Diverticulosis of large intestine without perforation or abscess without bleeding: Secondary | ICD-10-CM | POA: Diagnosis not present

## 2021-09-15 DIAGNOSIS — Z9889 Other specified postprocedural states: Secondary | ICD-10-CM | POA: Diagnosis not present

## 2021-09-15 DIAGNOSIS — R932 Abnormal findings on diagnostic imaging of liver and biliary tract: Secondary | ICD-10-CM

## 2021-09-15 DIAGNOSIS — Z9049 Acquired absence of other specified parts of digestive tract: Secondary | ICD-10-CM | POA: Diagnosis not present

## 2021-09-15 IMAGING — MR MR ABDOMEN WO/W CM
12 of 19 series · 26 of 48 positions shown · IV contrast (18ml multihance)
Comparison: MR abdomen, [DATE], [DATE], CT abdomen pelvis,
[DATE]

CLINICAL DATA: Abnormal liver CT follow-up incidental liver lesion

EXAM:
MRI ABDOMEN WITHOUT AND WITH CONTRAST
TECHNIQUE: Multiplanar multisequence MR imaging of the abdomen was performed
both before and after the administration of intravenous contrast.
CONTRAST:  18mL MULTIHANCE GADOBENATE DIMEGLUMINE 529 MG/ML IV SOLN

[Series 3: cor haste · coronal · 5.0mm · 0.68mm/px · 2 of 32 slices shown]
[im 1/32]
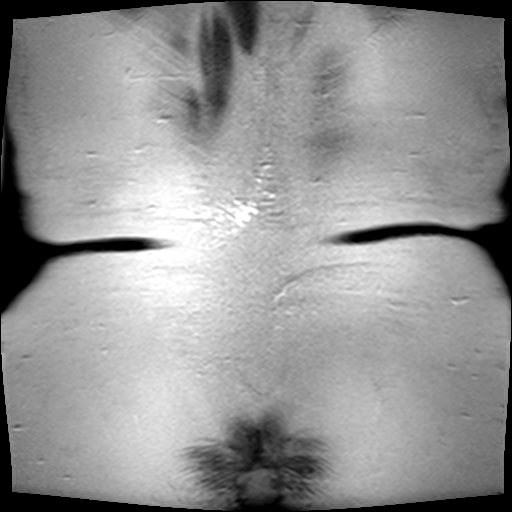
[im 32/32]
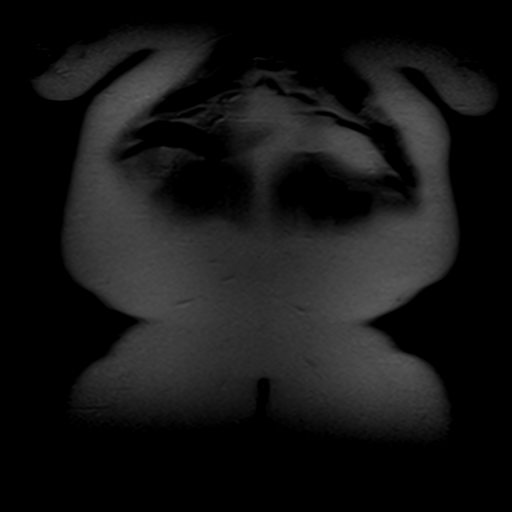

[Series 4: axial haste · axial · 6.0mm · 0.68mm/px · 1 of 33 slices shown]
[im 1/33]
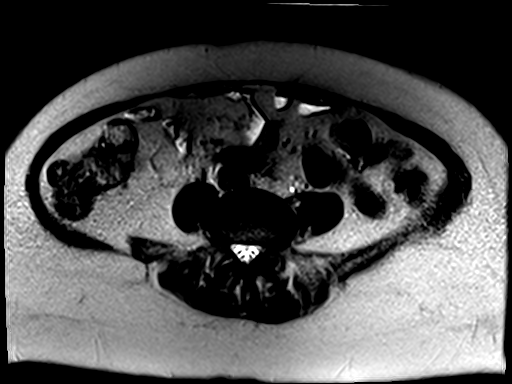

[Series 5: T1 · axial · 6.0mm · 0.68mm/px · z∈[-75,+143]mm · 2 of 68 slices shown]
[im 1/68]
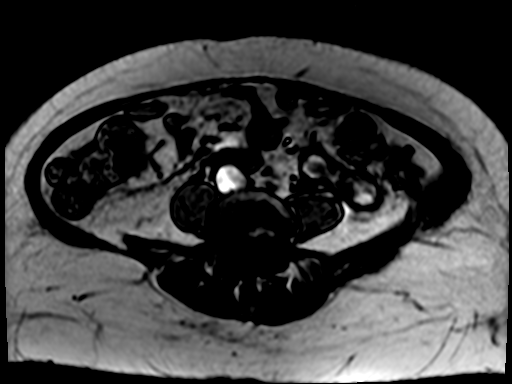
[im 68/68]
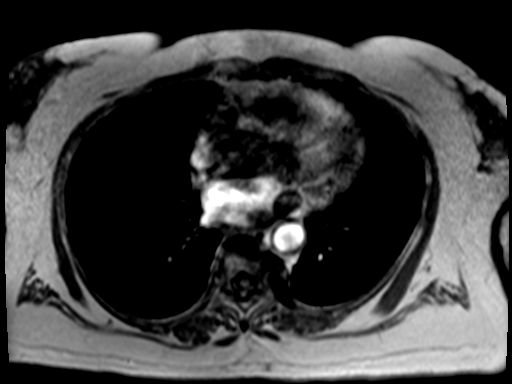

[Series 6: bSSFP · axial · 4.0mm · 0.68mm/px · z∈[-80,+148]mm · 2 of 58 slices shown]
[im 1/58]
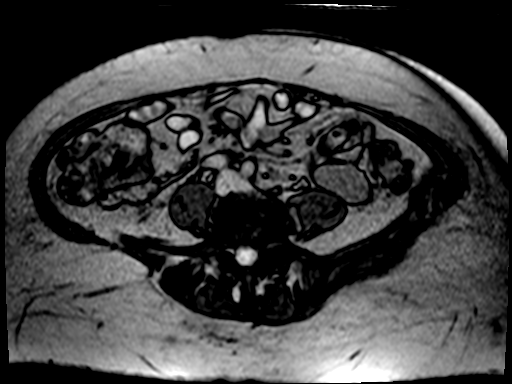
[im 58/58]
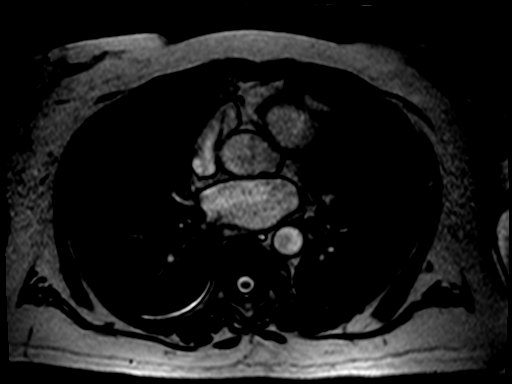

[Series 7: T2 fat-sat · axial · 6.0mm · 1.09mm/px · 1 of 32 slices shown]
[im 1/32]
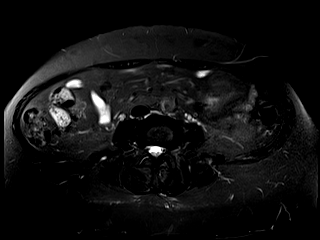

[Series 8: ep2d_diff_b50_500_800_p2_trig · axial · 6.0mm · 1.82mm/px · z∈[-50,+173]mm · 3 of 96 slices shown]
[im 1/96]
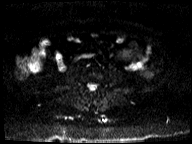
[im 48/96]
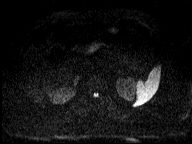
[im 96/96]
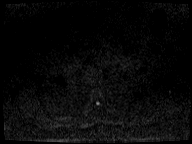

[Series 9: ep2d_diff_b50_500_800_p2_trig_adc · axial · 6.0mm · 1.82mm/px · 1 of 32 slices shown]
[im 1/32]
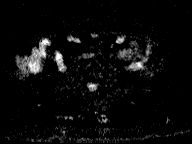

[Series 19: T1 dynamic · axial · non-contrast · 2.5mm · 0.74mm/px · z∈[-106,+112]mm · 3 of 88 slices shown]
[im 1/88]
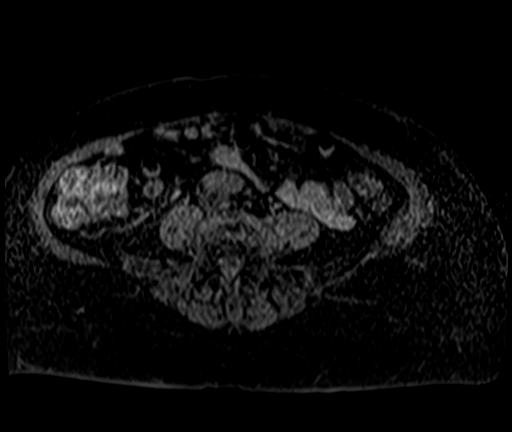
[im 44/88]
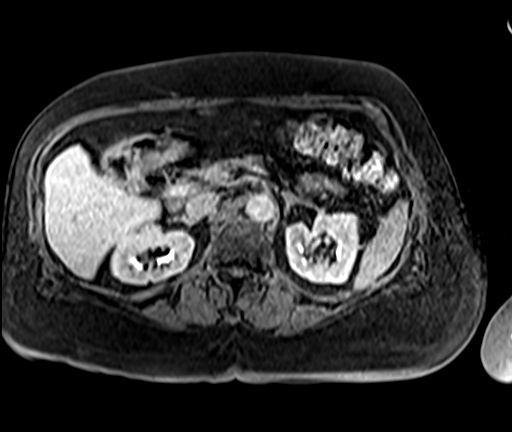
[im 88/88]
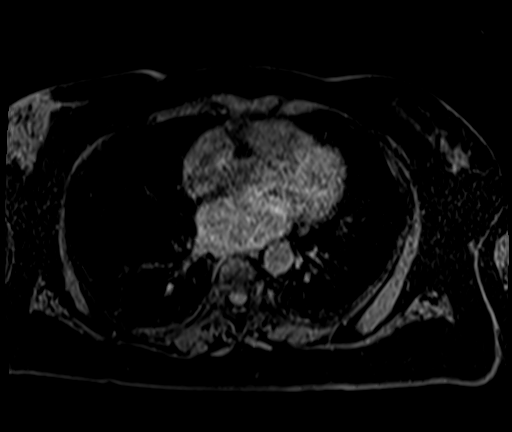

[Series 20: T1 dynamic post-contrast · axial · 2.5mm · 0.74mm/px · z∈[-106,+112]mm · 3 of 88 slices shown (1 of 4)]
[im 1/88]
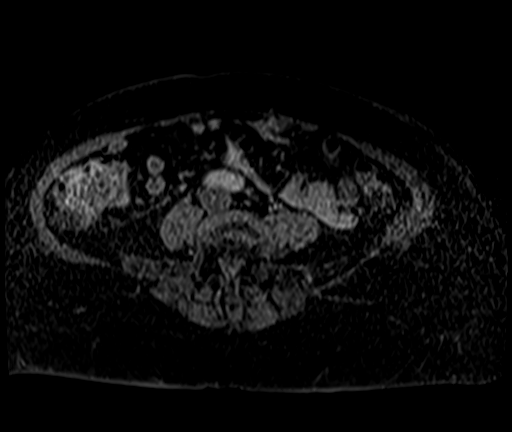
[im 44/88]
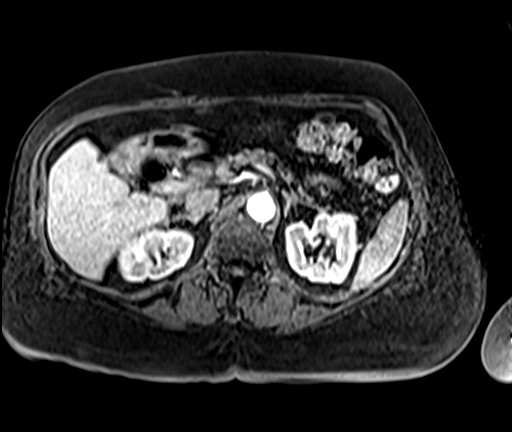
[im 88/88]
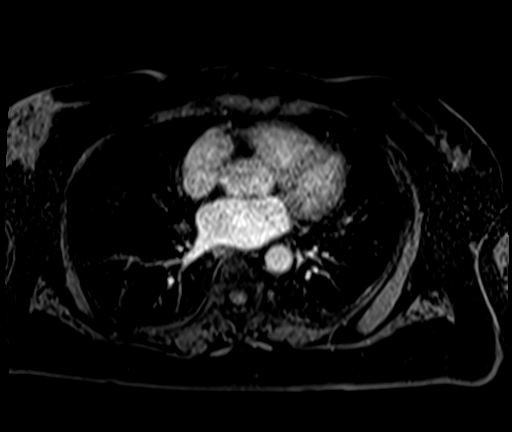

[Series 21: T1 dynamic post-contrast · axial · 2.5mm · 0.74mm/px · z∈[-106,+112]mm · 3 of 88 slices shown (2 of 4)]
[im 1/88]
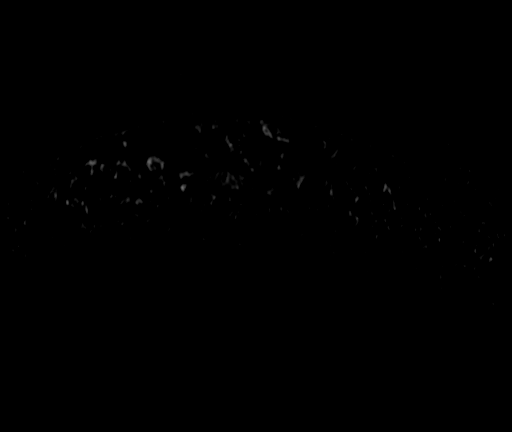
[im 44/88]
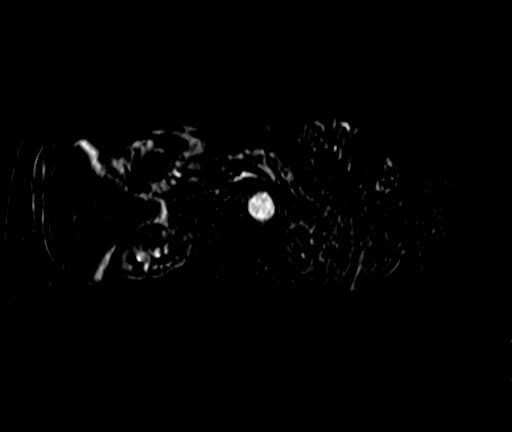
[im 88/88]
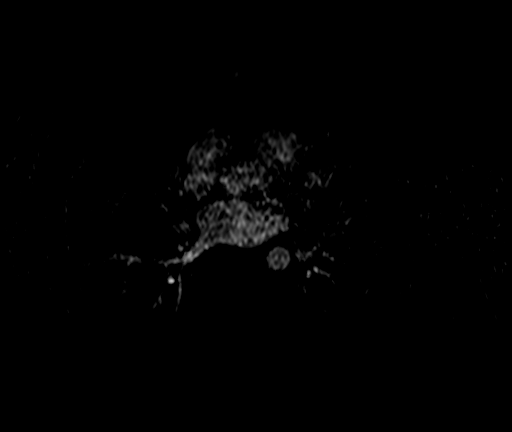

[Series 22: T1 dynamic post-contrast · axial · 2.5mm · 0.74mm/px · z∈[-106,+112]mm · 3 of 88 slices shown (3 of 4)]
[im 1/88]
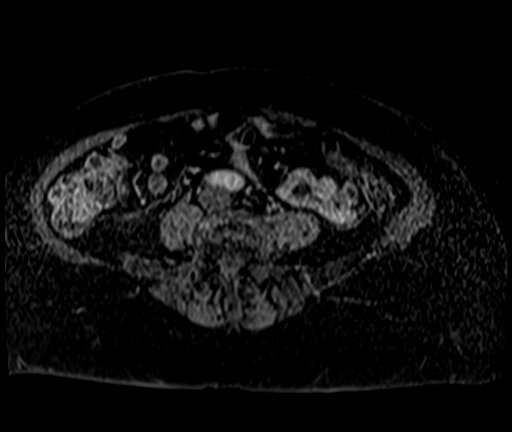
[im 44/88]
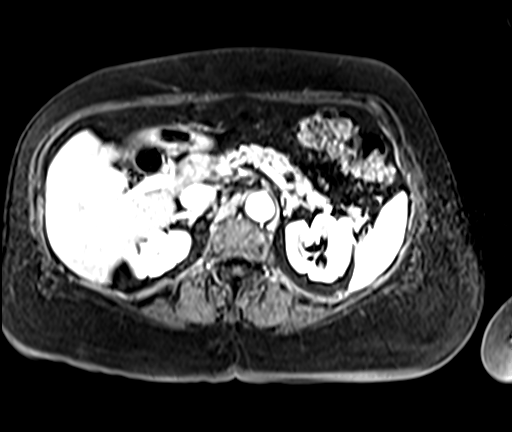
[im 88/88]
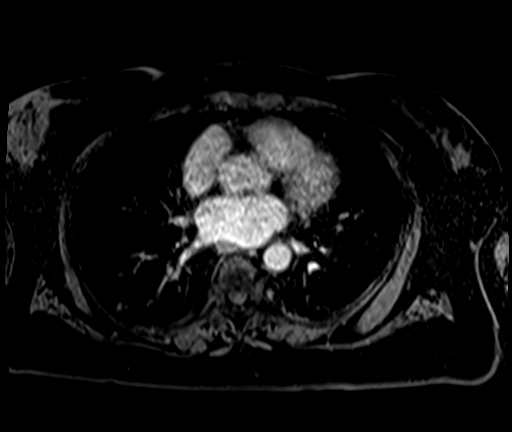

[Series 23: T1 dynamic post-contrast · axial · 2.5mm · 0.74mm/px · z∈[-106,+2]mm · 2 of 88 slices shown (4 of 4)]
[im 1/88]
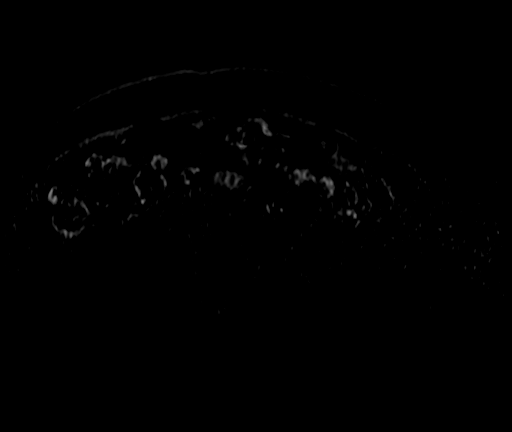
[im 44/88]
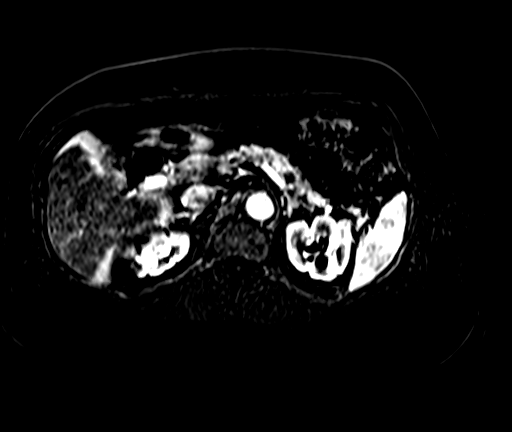

[26 of 48 positions shown; findings below may reference images not displayed]

FINDINGS: Examination is generally limited by breath motion artifact.

Lower chest: No acute findings. Moderate hiatal hernia with
intrathoracic position of the gastric fundus.

Hepatobiliary: No mass or other parenchymal abnormality identified.
Although somewhat breath motion limited, a previously described
lesion of the posterior left lobe of the liver, hepatic segment II,
is not clearly visualized on the current examination, specifically
without evidence of signal abnormality or abnormal contrast
enhancement. Status post cholecystectomy. Postoperative biliary
ductal dilatation and pneumobilia.

Pancreas: No mass, inflammatory changes, or other parenchymal
abnormality identified.No pancreatic ductal dilatation.

Spleen:  Within normal limits in size and appearance.

Adrenals/Urinary Tract: Normal adrenal glands. No renal masses or
suspicious contrast enhancement identified. No evidence of
hydronephrosis.

Stomach/Bowel: Colonic diverticulosis.

Vascular/Lymphatic: No pathologically enlarged lymph nodes
identified. No abdominal aortic aneurysm demonstrated.

Other:  None.

Musculoskeletal: No suspicious osseous lesions identified.
IMPRESSION: 1. Although examination is somewhat breath motion limited, a
previously described lesion of the posterior left lobe of the liver,
hepatic segment II, is not clearly visualized on today's
examination, specifically without evidence of signal abnormality or
abnormal contrast enhancement in this vicinity. This is of uncertain
significance although previous imaging characteristics are
suggestive of a benign, incidental hemangioma. Due to breath motion
limitations on this examination and previous examination, consider
multiphasic contrast enhanced CT for further follow-up if desired.
2. Status post cholecystectomy. Postoperative biliary ductal
dilatation and pneumobilia.
3. Moderate hiatal hernia.
4. Colonic diverticulosis.

## 2021-09-15 MED ORDER — GADOBENATE DIMEGLUMINE 529 MG/ML IV SOLN
18.0000 mL | Freq: Once | INTRAVENOUS | Status: AC | PRN
  Administered 2021-09-15: 18 mL via INTRAVENOUS

## 2021-09-27 DIAGNOSIS — I10 Essential (primary) hypertension: Secondary | ICD-10-CM | POA: Diagnosis not present

## 2021-09-27 DIAGNOSIS — E782 Mixed hyperlipidemia: Secondary | ICD-10-CM | POA: Diagnosis not present

## 2021-10-08 DIAGNOSIS — H401131 Primary open-angle glaucoma, bilateral, mild stage: Secondary | ICD-10-CM | POA: Diagnosis not present

## 2021-10-08 DIAGNOSIS — H04123 Dry eye syndrome of bilateral lacrimal glands: Secondary | ICD-10-CM | POA: Diagnosis not present

## 2021-10-08 DIAGNOSIS — H25813 Combined forms of age-related cataract, bilateral: Secondary | ICD-10-CM | POA: Diagnosis not present

## 2021-10-27 DIAGNOSIS — E782 Mixed hyperlipidemia: Secondary | ICD-10-CM | POA: Diagnosis not present

## 2021-10-27 DIAGNOSIS — M130 Polyarthritis, unspecified: Secondary | ICD-10-CM | POA: Diagnosis not present

## 2021-10-27 DIAGNOSIS — Z0000 Encounter for general adult medical examination without abnormal findings: Secondary | ICD-10-CM | POA: Diagnosis not present

## 2021-10-27 DIAGNOSIS — I2601 Septic pulmonary embolism with acute cor pulmonale: Secondary | ICD-10-CM | POA: Diagnosis not present

## 2021-10-27 DIAGNOSIS — I10 Essential (primary) hypertension: Secondary | ICD-10-CM | POA: Diagnosis not present

## 2021-10-27 DIAGNOSIS — B353 Tinea pedis: Secondary | ICD-10-CM | POA: Diagnosis not present

## 2021-10-28 DIAGNOSIS — I10 Essential (primary) hypertension: Secondary | ICD-10-CM | POA: Diagnosis not present

## 2021-10-28 DIAGNOSIS — E782 Mixed hyperlipidemia: Secondary | ICD-10-CM | POA: Diagnosis not present

## 2021-11-20 DIAGNOSIS — Z1212 Encounter for screening for malignant neoplasm of rectum: Secondary | ICD-10-CM | POA: Diagnosis not present

## 2021-11-20 DIAGNOSIS — Z1211 Encounter for screening for malignant neoplasm of colon: Secondary | ICD-10-CM | POA: Diagnosis not present

## 2022-01-28 DIAGNOSIS — I10 Essential (primary) hypertension: Secondary | ICD-10-CM | POA: Diagnosis not present

## 2022-01-28 DIAGNOSIS — B353 Tinea pedis: Secondary | ICD-10-CM | POA: Diagnosis not present

## 2022-01-28 DIAGNOSIS — Z86711 Personal history of pulmonary embolism: Secondary | ICD-10-CM | POA: Diagnosis not present

## 2022-01-28 DIAGNOSIS — B356 Tinea cruris: Secondary | ICD-10-CM | POA: Diagnosis not present

## 2022-01-28 DIAGNOSIS — M545 Low back pain, unspecified: Secondary | ICD-10-CM | POA: Diagnosis not present

## 2022-01-28 DIAGNOSIS — E782 Mixed hyperlipidemia: Secondary | ICD-10-CM | POA: Diagnosis not present

## 2022-02-15 DIAGNOSIS — Z1231 Encounter for screening mammogram for malignant neoplasm of breast: Secondary | ICD-10-CM | POA: Diagnosis not present

## 2022-03-30 DIAGNOSIS — M545 Low back pain, unspecified: Secondary | ICD-10-CM | POA: Diagnosis not present

## 2022-03-30 DIAGNOSIS — I10 Essential (primary) hypertension: Secondary | ICD-10-CM | POA: Diagnosis not present

## 2022-03-30 DIAGNOSIS — B353 Tinea pedis: Secondary | ICD-10-CM | POA: Diagnosis not present

## 2022-04-08 DIAGNOSIS — H25813 Combined forms of age-related cataract, bilateral: Secondary | ICD-10-CM | POA: Diagnosis not present

## 2022-04-08 DIAGNOSIS — H401131 Primary open-angle glaucoma, bilateral, mild stage: Secondary | ICD-10-CM | POA: Diagnosis not present

## 2022-04-08 DIAGNOSIS — H04123 Dry eye syndrome of bilateral lacrimal glands: Secondary | ICD-10-CM | POA: Diagnosis not present

## 2022-06-30 DIAGNOSIS — I10 Essential (primary) hypertension: Secondary | ICD-10-CM | POA: Diagnosis not present

## 2022-06-30 DIAGNOSIS — E782 Mixed hyperlipidemia: Secondary | ICD-10-CM | POA: Diagnosis not present

## 2022-10-06 DIAGNOSIS — H25813 Combined forms of age-related cataract, bilateral: Secondary | ICD-10-CM | POA: Diagnosis not present

## 2022-10-06 DIAGNOSIS — H401131 Primary open-angle glaucoma, bilateral, mild stage: Secondary | ICD-10-CM | POA: Diagnosis not present

## 2022-10-06 DIAGNOSIS — H04123 Dry eye syndrome of bilateral lacrimal glands: Secondary | ICD-10-CM | POA: Diagnosis not present

## 2022-10-29 DIAGNOSIS — R2689 Other abnormalities of gait and mobility: Secondary | ICD-10-CM | POA: Diagnosis not present

## 2022-10-29 DIAGNOSIS — B353 Tinea pedis: Secondary | ICD-10-CM | POA: Diagnosis not present

## 2022-10-29 DIAGNOSIS — M130 Polyarthritis, unspecified: Secondary | ICD-10-CM | POA: Diagnosis not present

## 2022-10-29 DIAGNOSIS — I10 Essential (primary) hypertension: Secondary | ICD-10-CM | POA: Diagnosis not present

## 2022-10-29 DIAGNOSIS — B351 Tinea unguium: Secondary | ICD-10-CM | POA: Diagnosis not present

## 2022-10-29 DIAGNOSIS — E782 Mixed hyperlipidemia: Secondary | ICD-10-CM | POA: Diagnosis not present

## 2022-11-10 ENCOUNTER — Encounter: Payer: Self-pay | Admitting: Podiatry

## 2022-11-10 ENCOUNTER — Ambulatory Visit (INDEPENDENT_AMBULATORY_CARE_PROVIDER_SITE_OTHER): Payer: Medicare Other | Admitting: Podiatry

## 2022-11-10 DIAGNOSIS — B351 Tinea unguium: Secondary | ICD-10-CM | POA: Diagnosis not present

## 2022-11-10 DIAGNOSIS — M79674 Pain in right toe(s): Secondary | ICD-10-CM | POA: Diagnosis not present

## 2022-11-10 DIAGNOSIS — M79675 Pain in left toe(s): Secondary | ICD-10-CM

## 2022-11-10 NOTE — Progress Notes (Signed)
  Subjective:  Patient ID: Paula Bentley, female    DOB: 1940/07/06,  MRN: [id number]  Chief Complaint  Patient presents with   Nail Problem    Bilateral great toenail thicken. Patient PCP put her on lamisil.  Also patient got athletic foot cream to soften her toenails    82 y.o. female returns for the above complaint.  Patient presents with thickened elongated dystrophic mycotic toenails x 10 mild pain on palpation.  Objective:  There were no vitals filed for this visit. Podiatric Exam: Vascular: dorsalis pedis and posterior tibial pulses are palpable bilateral. Capillary return is immediate. Temperature gradient is WNL. Skin turgor WNL  Sensorium: Normal Semmes Weinstein monofilament test. Normal tactile sensation bilaterally. Nail Exam: Pt has thick disfigured discolored nails with subungual debris noted bilateral entire nail hallux through fifth toenails.  Pain on palpation to the nails. Ulcer Exam: There is no evidence of ulcer or pre-ulcerative changes or infection. Orthopedic Exam: Muscle tone and strength are WNL. No limitations in general ROM. No crepitus or effusions noted.  Skin: No Porokeratosis. No infection or ulcers    Assessment & Plan:   1. Pain due to onychomycosis of toenails of both feet     Patient was evaluated and treated and all questions answered.  Onychomycosis with pain  -Nails palliatively debrided as below. -Educated on self-care  Procedure: Nail Debridement Rationale: pain  Type of Debridement: manual, sharp debridement. Instrumentation: Nail nipper, rotary burr. Number of Nails: 10  Procedures and Treatment: Consent by patient was obtained for treatment procedures. The patient understood the discussion of treatment and procedures well. All questions were answered thoroughly reviewed. Debridement of mycotic and hypertrophic toenails, 1 through 5 bilateral and clearing of subungual debris. No ulceration, no infection noted.  Return  Visit-Office Procedure: Patient instructed to return to the office for a follow up visit 3 months for continued evaluation and treatment.  Nicholes Rough, DPM    Return in about 3 months (around 02/10/2023) for RFC .

## 2022-12-24 DIAGNOSIS — I82503 Chronic embolism and thrombosis of unspecified deep veins of lower extremity, bilateral: Secondary | ICD-10-CM | POA: Diagnosis not present

## 2022-12-24 DIAGNOSIS — B351 Tinea unguium: Secondary | ICD-10-CM | POA: Diagnosis not present

## 2022-12-24 DIAGNOSIS — E782 Mixed hyperlipidemia: Secondary | ICD-10-CM | POA: Diagnosis not present

## 2022-12-24 DIAGNOSIS — I10 Essential (primary) hypertension: Secondary | ICD-10-CM | POA: Diagnosis not present

## 2023-01-21 DIAGNOSIS — B351 Tinea unguium: Secondary | ICD-10-CM | POA: Diagnosis not present

## 2023-02-10 ENCOUNTER — Ambulatory Visit: Payer: Medicare Other | Admitting: Podiatry

## 2023-02-10 ENCOUNTER — Encounter: Payer: Self-pay | Admitting: Podiatry

## 2023-02-10 DIAGNOSIS — M79675 Pain in left toe(s): Secondary | ICD-10-CM

## 2023-02-10 DIAGNOSIS — M79674 Pain in right toe(s): Secondary | ICD-10-CM

## 2023-02-10 DIAGNOSIS — B351 Tinea unguium: Secondary | ICD-10-CM | POA: Insufficient documentation

## 2023-02-10 NOTE — Progress Notes (Signed)
This patient presents to the office with chief complaint of long thick painful nails.  Patient says the nails are painful walking and wearing shoes.  This patient is unable to self treat.  This patient is unable to trim her nails since she is unable to reach her nails. She was prescribed lamisil by her PCP.  She also bought athlete foot medication . She presents to the office for preventative foot care services.  General Appearance  Alert, conversant and in no acute stress.  Vascular  Dorsalis pedis and posterior tibial  pulses are palpable  bilaterally.  Capillary return is within normal limits  bilaterally. Temperature is within normal limits  bilaterally.  Neurologic  Senn-Weinstein monofilament wire test within normal limits  bilaterally. Muscle power within normal limits bilaterally.  Nails Thick disfigured discolored nails with subungual debris  hallux nails bilaterally. No evidence of bacterial infection or drainage bilaterally.  Orthopedic  No limitations of motion  feet .  No crepitus or effusions noted.  No bony pathology or digital deformities noted.  Skin  normotropic skin with no porokeratosis noted bilaterally.  No signs of infections or ulcers noted.     Onychomycosis  Nails  B/L.  Pain in right toes  Pain in left toes  Debridement of nails both feet followed trimming the nails with dremel tool.    RTC 3 months.   Helane Gunther DPM

## 2023-02-18 DIAGNOSIS — Z1231 Encounter for screening mammogram for malignant neoplasm of breast: Secondary | ICD-10-CM | POA: Diagnosis not present

## 2023-03-07 DIAGNOSIS — I10 Essential (primary) hypertension: Secondary | ICD-10-CM | POA: Diagnosis not present

## 2023-03-07 DIAGNOSIS — Z0000 Encounter for general adult medical examination without abnormal findings: Secondary | ICD-10-CM | POA: Diagnosis not present

## 2023-03-07 DIAGNOSIS — B351 Tinea unguium: Secondary | ICD-10-CM | POA: Diagnosis not present

## 2023-03-07 DIAGNOSIS — I2601 Septic pulmonary embolism with acute cor pulmonale: Secondary | ICD-10-CM | POA: Diagnosis not present

## 2023-03-07 DIAGNOSIS — B353 Tinea pedis: Secondary | ICD-10-CM | POA: Diagnosis not present

## 2023-03-07 DIAGNOSIS — Z23 Encounter for immunization: Secondary | ICD-10-CM | POA: Diagnosis not present

## 2023-03-07 DIAGNOSIS — E782 Mixed hyperlipidemia: Secondary | ICD-10-CM | POA: Diagnosis not present

## 2023-03-21 DIAGNOSIS — E782 Mixed hyperlipidemia: Secondary | ICD-10-CM | POA: Diagnosis not present

## 2023-03-21 DIAGNOSIS — I82503 Chronic embolism and thrombosis of unspecified deep veins of lower extremity, bilateral: Secondary | ICD-10-CM | POA: Diagnosis not present

## 2023-03-21 DIAGNOSIS — I10 Essential (primary) hypertension: Secondary | ICD-10-CM | POA: Diagnosis not present

## 2023-03-21 DIAGNOSIS — I2601 Septic pulmonary embolism with acute cor pulmonale: Secondary | ICD-10-CM | POA: Diagnosis not present

## 2023-03-24 DIAGNOSIS — Z7901 Long term (current) use of anticoagulants: Secondary | ICD-10-CM | POA: Diagnosis not present

## 2023-04-06 DIAGNOSIS — H401131 Primary open-angle glaucoma, bilateral, mild stage: Secondary | ICD-10-CM | POA: Diagnosis not present

## 2023-04-26 ENCOUNTER — Inpatient Hospital Stay: Payer: Medicare Other

## 2023-04-26 ENCOUNTER — Inpatient Hospital Stay: Payer: Medicare Other | Attending: Hematology and Oncology | Admitting: Hematology and Oncology

## 2023-04-26 VITALS — BP 135/83 | HR 60 | Temp 98.0°F | Resp 13 | Wt 176.1 lb

## 2023-04-26 DIAGNOSIS — Z7901 Long term (current) use of anticoagulants: Secondary | ICD-10-CM | POA: Insufficient documentation

## 2023-04-26 DIAGNOSIS — Z86711 Personal history of pulmonary embolism: Secondary | ICD-10-CM | POA: Insufficient documentation

## 2023-04-26 LAB — CBC WITH DIFFERENTIAL (CANCER CENTER ONLY)
Abs Immature Granulocytes: 0.01 10*3/uL (ref 0.00–0.07)
Basophils Absolute: 0 10*3/uL (ref 0.0–0.1)
Basophils Relative: 1 %
Eosinophils Absolute: 0.1 10*3/uL (ref 0.0–0.5)
Eosinophils Relative: 2 %
HCT: 40.3 % (ref 36.0–46.0)
Hemoglobin: 13.3 g/dL (ref 12.0–15.0)
Immature Granulocytes: 0 %
Lymphocytes Relative: 38 %
Lymphs Abs: 1.9 10*3/uL (ref 0.7–4.0)
MCH: 31 pg (ref 26.0–34.0)
MCHC: 33 g/dL (ref 30.0–36.0)
MCV: 93.9 fL (ref 80.0–100.0)
Monocytes Absolute: 0.6 10*3/uL (ref 0.1–1.0)
Monocytes Relative: 12 %
Neutro Abs: 2.4 10*3/uL (ref 1.7–7.7)
Neutrophils Relative %: 47 %
Platelet Count: 156 10*3/uL (ref 150–400)
RBC: 4.29 MIL/uL (ref 3.87–5.11)
RDW: 13.5 % (ref 11.5–15.5)
WBC Count: 5 10*3/uL (ref 4.0–10.5)
nRBC: 0 % (ref 0.0–0.2)

## 2023-04-26 LAB — CMP (CANCER CENTER ONLY)
ALT: 6 U/L (ref 0–44)
AST: 12 U/L — ABNORMAL LOW (ref 15–41)
Albumin: 4 g/dL (ref 3.5–5.0)
Alkaline Phosphatase: 67 U/L (ref 38–126)
Anion gap: 5 (ref 5–15)
BUN: 15 mg/dL (ref 8–23)
CO2: 28 mmol/L (ref 22–32)
Calcium: 9.6 mg/dL (ref 8.9–10.3)
Chloride: 110 mmol/L (ref 98–111)
Creatinine: 0.77 mg/dL (ref 0.44–1.00)
GFR, Estimated: >60 mL/min (ref >60)
Glucose, Bld: 112 mg/dL — ABNORMAL HIGH (ref 70–99)
Potassium: 3.8 mmol/L (ref 3.5–5.1)
Sodium: 143 mmol/L (ref 135–145)
Total Bilirubin: 0.6 mg/dL (ref <1.2)
Total Protein: 7 g/dL (ref 6.5–8.1)

## 2023-04-26 MED ORDER — RIVAROXABAN 10 MG PO TABS: 10.0000 mg | ORAL_TABLET | Freq: Every day | ORAL | 5 refills | Status: DC

## 2023-04-26 NOTE — Progress Notes (Signed)
Xarelto payment assistance form provided to patient with instructions to complete her section, gather required documents and return form to office for assistance with returning to the company for processing. Patient does not have access to a fax machine.

## 2023-04-26 NOTE — Progress Notes (Signed)
[facility] [phone]   [phone]  INITIAL CONSULT NOTE  Patient Care Team: Renaye Rakers, MD as PCP - General (Family Medicine)  Hematological/Oncological History # History of Pulmonary Embolism 04/26/2023: establish care with Dr. Leonides Schanz   CHIEF COMPLAINTS/PURPOSE OF CONSULTATION:  "History of Pulmonary Embolism "  HISTORY OF PRESENTING ILLNESS:  Paula Bentley 82 y.o. female with medical history significant for glaucoma who presents for evaluation of a history of pulmonary embolism, currently on chronic anticoagulation.   On review of the previous records Paula Bentley was seen on 03/21/2023 by her PCP who noted her Xarelto medication was $129/month. She expressed a desire to discontinue the medication. Reports her PE was a septic emboli treated in [place].   On exam today Paula Bentley reports that she feels like her Xarelto "hurts her legs".  She reports that she has these "big ovals" bilaterally that are sore when she takes Eliquis therapy.  She reports there is no bruising or lumps or bumps at the site but just an oval area that is tender when she takes her Xarelto.  She reports that she is currently taking 20 mg p.o. daily.  She notes that she was initially diagnosed with a blood clot in [place], likely [facility].  She reports that [place] [employer] [place]  She reports that she does not remember exactly when this was but thinks was about 3 to 4 years ago.  She moved to [place] about 2 years ago.  She is currently paying about $120 per month for her Xarelto and is hoping to get off the medication if possible.  She reports she is tolerating Xarelto well other than the oval areas of discomfort.  She is not having any bleeding, bruising, or dark stools.  This is her 1 and only clot and she has not had any subsequent clots.  She reports that she does not have any other recent  major changes in her health.  On further discussion Paula Bentley reports that she has no history of clots in the family and no MI or CVA.  Her parents both died of old age and she has 1 son who is healthy at age 76.  She has 5 sisters and 1 brother who has passed.  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  She otherwise denies any fevers, chills, sweats, nausea, Mi or diarrhea.  A full 10 point ROS is otherwise negative.  MEDICAL HISTORY:  Past Medical History:  Diagnosis Date   Glaucoma     SURGICAL HISTORY: Past Surgical History:  Procedure Laterality Date   ABDOMINAL HYSTERECTOMY     CHOLECYSTECTOMY     ERCP N/A 11/06/2020   Procedure: ENDOSCOPIC RETROGRADE CHOLANGIOPANCREATOGRAPHY (ERCP);  Surgeon: Vida Rigger, MD;  Location: [facility];  Service: Endoscopy;  Laterality: N/A;   REMOVAL OF STONES  11/06/2020   Procedure: REMOVAL OF STONES;  Surgeon: Vida Rigger, MD;  Location: [facility];  Service: Endoscopy;;   SPHINCTEROTOMY  11/06/2020   Procedure: [place];  Surgeon: Vida Rigger, MD;  Location: [facility];  Service: Endoscopy;;    SOCIAL HISTORY: Social History   Socioeconomic History   Marital status: Widowed    Spouse name: Not on file   Number of children: Not on file   Years of education: Not on file   Highest education  level: Not on file  Occupational History   Not on file  Tobacco Use   Smoking status: Never   Smokeless tobacco: Never  Vaping Use   Vaping status: Never Used  Substance and Sexual Activity   Alcohol use: Never   Drug use: Never   Sexual activity: Not on file  Other Topics Concern   Not on file  Social History Narrative   Not on file   Social Determinants of Health   Financial Resource Strain: Not on file  Food Insecurity: Not on file  Transportation Needs: Not on file  Physical Activity: Not on file  Stress: Not on file  Social Connections:  Not on file  Intimate Partner Violence: Not on file    FAMILY HISTORY: Family History  Problem Relation Age of Onset   Diabetes Neg Hx     ALLERGIES:  has No Known Allergies.  MEDICATIONS:  Current Outpatient Medications  Medication Sig Dispense Refill   acetaminophen (TYLENOL) 325 MG tablet Take 650 mg by mouth every 6 (six) hours as needed for mild pain, fever or headache.     dorzolamide-timolol (COSOPT) 22.3-6.8 MG/ML ophthalmic solution Place 1 drop into both eyes 2 (two) times daily.     latanoprost (XALATAN) 0.005 % ophthalmic solution Place 1 drop into both eyes at bedtime.     losartan (COZAAR) 25 MG tablet Take 25 mg by mouth daily.     rivaroxaban (XARELTO) 10 MG TABS tablet Take 1 tablet (10 mg total) by mouth daily. 30 tablet 5   terbinafine (LAMISIL) 250 MG tablet Take 250 mg by mouth daily.     trolamine salicylate (ASPERCREME) 10 % cream Apply 1 application topically as needed for muscle pain.     polyethylene glycol (MIRALAX / GLYCOLAX) 17 g packet Take 17 g by mouth daily. (Patient not taking: Reported on 04/26/2023) 14 each 0   No current facility-administered medications for this visit.    REVIEW OF SYSTEMS:   Constitutional: ( - ) fevers, ( - )  chills , ( - ) night sweats Eyes: ( - ) blurriness of vision, ( - ) double vision, ( - ) watery eyes Ears, nose, mouth, throat, and face: ( - ) mucositis, ( - ) sore throat Respiratory: ( - ) cough, ( - ) dyspnea, ( - ) wheezes Cardiovascular: ( - ) palpitation, ( - ) chest discomfort, ( - ) lower extremity swelling Gastrointestinal:  ( - ) nausea, ( - ) heartburn, ( - ) change in bowel habits Skin: ( - ) abnormal skin rashes Lymphatics: ( - ) new lymphadenopathy, ( - ) easy bruising Neurological: ( - ) numbness, ( - ) tingling, ( - ) new weaknesses Behavioral/Psych: ( - ) mood change, ( - ) new changes  All other systems were reviewed with the patient and are negative.  PHYSICAL EXAMINATION:  Vitals:   04/26/23  0904  BP: 135/83  Pulse: 60  Resp: 13  Temp: 98 F (36.7 C)  SpO2: 100%   Filed Weights   04/26/23 0904  Weight: 176 lb 1.6 oz (79.9 kg)    GENERAL: well appearing elderly African American female in NAD  SKIN: skin color, texture, turgor are normal, no rashes or significant lesions EYES: conjunctiva are pink and non-injected, sclera clear LUNGS: clear to auscultation and percussion with normal breathing effort HEART: regular rate & rhythm and no murmurs and no lower extremity edema Musculoskeletal: no cyanosis of digits and no clubbing  PSYCH: alert &  oriented x 3, fluent speech NEURO: no focal motor/sensory deficits  LABORATORY DATA:  I have reviewed the data as listed    Latest Ref Rng & Units 11/09/2020    5:07 AM 11/08/2020    2:44 AM 11/07/2020    3:02 AM  CBC  WBC 4.0 - 10.5 K/uL 7.9  10.5  10.1   Hemoglobin 12.0 - 15.0 g/dL 40.9  81.1  91.4   Hematocrit 36.0 - 46.0 % 36.4  35.9  37.3   Platelets 150 - 400 K/uL 119  127  128        Latest Ref Rng & Units 11/11/2020    5:14 AM 11/09/2020    5:07 AM 11/08/2020    2:44 AM  CMP  Glucose 70 - 99 mg/dL 782  99  956   BUN 8 - 23 mg/dL 10  15  22    Creatinine 0.44 - 1.00 mg/dL 2.13  0.86  5.78   Sodium 135 - 145 mmol/L 141  139  138   Potassium 3.5 - 5.1 mmol/L 3.8  3.6  3.7   Chloride 98 - 111 mmol/L 110  109  110   CO2 22 - 32 mmol/L 25  21  22    Calcium 8.9 - 10.3 mg/dL 8.9  8.9  9.2   Total Protein 6.5 - 8.1 g/dL 5.5  5.6  5.7   Total Bilirubin 0.3 - 1.2 mg/dL 0.4  1.1  1.0   Alkaline Phos 38 - 126 U/L 50  55  64   AST 15 - 41 U/L 31  26  26    ALT 0 - 44 U/L 33  46  57      ASSESSMENT & PLAN Paula Bentley 82 y.o. female with medical history significant for glaucoma who presents for evaluation of a history of pulmonary embolism, currently on chronic anticoagulation.   After review of the labs, review of the records, and discussion with the patient the patients findings are most consistent with a  pulmonary embolism of unclear etiology.  Patient does not remember the circumstances around the time she developed a blood clot and there are no records available from the time of the blood clot.  Therefore we would need to assume that this is an unprovoked VTE while we are trying to find the root cause in outside records.  A provoked venous thromboembolism (VTE) is one that has a clear inciting factor or event. Provoking factors include prolonged travel/immobility, surgery (particular abdominal or orthropedic), trauma,  and pregnancy/ estrogen containing birth control. After a detailed history and review of the records there is no clear provoking factor for this patient's VTE.  Patients with unprovoked VTEs have up to 25% recurrence after 5 years and 36% at 10 years, with 4% of these clots being fatal (BMJ?2019;366:l4363). Therefore the formal recommendation for unprovoked VTE's is lifelong anticoagulation, as the cause may not be transient or reversible. We recommend 6 months or full strength anticoagulation with a re-evaluation after that time.  The patient's will then have a choice of maintenance dose DOAC (preferred, recommended), 81mg  ASA PO daily (non-preferred), or no further anticoagulation (not recommended).    #Pulmonary Embolism, Unknown/Unclear Etiology  # Reported Septic Emboli  --findings at this time are consistent with a provoked VTE, as a septic emboli is of infectious etiology and would therefore considered provoked.  Would need outside records in order to verify. --Unable to find records via Care Everywhere.  Patient thinks that she was seen at  [facility].  We will work on tracking down these records. --will order baseline CMP and CBC today --Okay to drop dosage of Xarelto to 10 mg p.o. daily as a maintenance dose. --patient denies any bleeding, bruising, or dark stools on this medication. It is well tolerated. No difficulties accessing/affording the medication  --RTC  placeholder in 6 months.   Orders Placed This Encounter  Procedures   CBC with Differential (Cancer Center Only)    Standing Status:   Future    Number of Occurrences:   1    Standing Expiration Date:   04/25/2024   CMP (Cancer Center only)    Standing Status:   Future    Number of Occurrences:   1    Standing Expiration Date:   04/25/2024   Beta-2-glycoprotein i abs, IgG/M/A    Standing Status:   Future    Number of Occurrences:   1    Standing Expiration Date:   04/25/2024   Cardiolipin antibodies, IgG, IgM, IgA*    Standing Status:   Future    Number of Occurrences:   1    Standing Expiration Date:   04/25/2024   All questions were answered. The patient knows to call the clinic with any problems, questions or concerns.  A total of more than 60 minutes were spent on this encounter with face-to-face time and non-face-to-face time, including preparing to see the patient, ordering tests and/or medications, counseling the patient and coordination of care as outlined above.   Ulysees Barns, MD Department of Hematology/Oncology [facility] at [facility] Phone: [phone] Pager: [phone] Email: [email or web address]  04/26/2023 1:32 PM

## 2023-04-27 LAB — BETA-2-GLYCOPROTEIN I ABS, IGG/M/A
Beta-2 Glyco I IgG: <9 GPI IgG units (ref 0–20)
Beta-2-Glycoprotein I IgA: <9 GPI IgA units (ref 0–25)
Beta-2-Glycoprotein I IgM: <9 GPI IgM units (ref 0–32)

## 2023-04-27 LAB — CARDIOLIPIN ANTIBODIES, IGG, IGM, IGA
Anticardiolipin IgA: <9 [APL'U]/mL (ref 0–11)
Anticardiolipin IgG: <9 [GPL'U]/mL (ref 0–14)
Anticardiolipin IgM: <9 [MPL'U]/mL (ref 0–12)

## 2023-05-02 DIAGNOSIS — I10 Essential (primary) hypertension: Secondary | ICD-10-CM | POA: Diagnosis not present

## 2023-05-02 DIAGNOSIS — I2601 Septic pulmonary embolism with acute cor pulmonale: Secondary | ICD-10-CM | POA: Diagnosis not present

## 2023-05-04 ENCOUNTER — Ambulatory Visit: Payer: Medicare Other | Admitting: Podiatry

## 2023-05-04 ENCOUNTER — Encounter: Payer: Self-pay | Admitting: Podiatry

## 2023-05-04 DIAGNOSIS — B351 Tinea unguium: Secondary | ICD-10-CM

## 2023-05-04 DIAGNOSIS — M79675 Pain in left toe(s): Secondary | ICD-10-CM | POA: Diagnosis not present

## 2023-05-04 DIAGNOSIS — M79674 Pain in right toe(s): Secondary | ICD-10-CM

## 2023-05-04 NOTE — Progress Notes (Signed)
This patient presents to the office with chief complaint of long thick painful nails.  Patient says the nails are painful walking and wearing shoes.  This patient is unable to self treat.  This patient is unable to trim her nails since she is unable to reach her nails. She was prescribed lamisil by her PCP.  She also bought athlete foot medication . She presents to the office for preventative foot care services.  General Appearance  Alert, conversant and in no acute stress.  Vascular  Dorsalis pedis and posterior tibial  pulses are palpable  bilaterally.  Capillary return is within normal limits  bilaterally. Temperature is within normal limits  bilaterally.  Neurologic  Senn-Weinstein monofilament wire test within normal limits  bilaterally. Muscle power within normal limits bilaterally.  Nails Thick disfigured discolored nails with subungual debris  hallux nails bilaterally. No evidence of bacterial infection or drainage bilaterally.  Orthopedic  No limitations of motion  feet .  No crepitus or effusions noted.  No bony pathology or digital deformities noted.  Skin  normotropic skin with no porokeratosis noted bilaterally.  No signs of infections or ulcers noted.     Onychomycosis  Nails  B/L.  Pain in right toes  Pain in left toes  Debridement of nails both feet followed trimming the nails with dremel tool.    RTC 3 months.   Helane Gunther DPM

## 2023-05-12 ENCOUNTER — Ambulatory Visit: Payer: Medicare Other | Admitting: Podiatry

## 2023-06-23 DIAGNOSIS — R209 Unspecified disturbances of skin sensation: Secondary | ICD-10-CM | POA: Diagnosis not present

## 2023-06-23 DIAGNOSIS — I10 Essential (primary) hypertension: Secondary | ICD-10-CM | POA: Diagnosis not present

## 2023-06-23 DIAGNOSIS — B353 Tinea pedis: Secondary | ICD-10-CM | POA: Diagnosis not present

## 2023-06-23 DIAGNOSIS — B351 Tinea unguium: Secondary | ICD-10-CM | POA: Diagnosis not present

## 2023-07-14 DIAGNOSIS — E782 Mixed hyperlipidemia: Secondary | ICD-10-CM | POA: Diagnosis not present

## 2023-07-14 DIAGNOSIS — B351 Tinea unguium: Secondary | ICD-10-CM | POA: Diagnosis not present

## 2023-07-14 DIAGNOSIS — M130 Polyarthritis, unspecified: Secondary | ICD-10-CM | POA: Diagnosis not present

## 2023-07-14 DIAGNOSIS — Z86711 Personal history of pulmonary embolism: Secondary | ICD-10-CM | POA: Diagnosis not present

## 2023-07-14 DIAGNOSIS — I10 Essential (primary) hypertension: Secondary | ICD-10-CM | POA: Diagnosis not present

## 2023-08-08 ENCOUNTER — Ambulatory Visit: Payer: Medicare Other | Admitting: Podiatry

## 2023-08-08 ENCOUNTER — Encounter: Payer: Self-pay | Admitting: Podiatry

## 2023-08-08 DIAGNOSIS — M79675 Pain in left toe(s): Secondary | ICD-10-CM | POA: Diagnosis not present

## 2023-08-08 DIAGNOSIS — B351 Tinea unguium: Secondary | ICD-10-CM

## 2023-08-08 DIAGNOSIS — M79674 Pain in right toe(s): Secondary | ICD-10-CM

## 2023-08-08 NOTE — Progress Notes (Signed)
This patient presents to the office with chief complaint of long thick painful nails.  Patient says the nails are painful walking and wearing shoes.  This patient is unable to self treat.  This patient is unable to trim her nails since she is unable to reach her nails. She was prescribed lamisil by her PCP.  She also bought athlete foot medication . She presents to the office for preventative foot care services.  General Appearance  Alert, conversant and in no acute stress.  Vascular  Dorsalis pedis and posterior tibial  pulses are palpable  bilaterally.  Capillary return is within normal limits  bilaterally. Temperature is within normal limits  bilaterally.  Neurologic  Senn-Weinstein monofilament wire test within normal limits  bilaterally. Muscle power within normal limits bilaterally.  Nails Thick disfigured discolored nails with subungual debris  hallux nails bilaterally. No evidence of bacterial infection or drainage bilaterally.  Orthopedic  No limitations of motion  feet .  No crepitus or effusions noted.  No bony pathology or digital deformities noted.  Skin  normotropic skin with no porokeratosis noted bilaterally.  No signs of infections or ulcers noted.     Onychomycosis  Nails  B/L.  Pain in right toes  Pain in left toes  Debridement of nails both feet followed trimming the nails with dremel tool.    RTC 3 months.   Helane Gunther DPM

## 2023-09-12 DIAGNOSIS — I10 Essential (primary) hypertension: Secondary | ICD-10-CM | POA: Diagnosis not present

## 2023-09-12 DIAGNOSIS — B351 Tinea unguium: Secondary | ICD-10-CM | POA: Diagnosis not present

## 2023-09-12 DIAGNOSIS — E782 Mixed hyperlipidemia: Secondary | ICD-10-CM | POA: Diagnosis not present

## 2023-09-12 DIAGNOSIS — R7303 Prediabetes: Secondary | ICD-10-CM | POA: Diagnosis not present

## 2023-10-03 DIAGNOSIS — H401131 Primary open-angle glaucoma, bilateral, mild stage: Secondary | ICD-10-CM | POA: Diagnosis not present

## 2023-10-03 DIAGNOSIS — H25813 Combined forms of age-related cataract, bilateral: Secondary | ICD-10-CM | POA: Diagnosis not present

## 2023-10-03 DIAGNOSIS — H04123 Dry eye syndrome of bilateral lacrimal glands: Secondary | ICD-10-CM | POA: Diagnosis not present

## 2023-10-26 ENCOUNTER — Inpatient Hospital Stay: Payer: Medicare Other

## 2023-10-26 ENCOUNTER — Inpatient Hospital Stay: Payer: Medicare Other | Attending: Hematology and Oncology | Admitting: Hematology and Oncology

## 2023-10-26 ENCOUNTER — Other Ambulatory Visit: Payer: Self-pay | Admitting: Hematology and Oncology

## 2023-10-26 DIAGNOSIS — Z86711 Personal history of pulmonary embolism: Secondary | ICD-10-CM

## 2023-10-26 NOTE — Progress Notes (Deleted)
 [facility] [phone]) [phone]   [phone]  PROGRESS NOTE  Patient Care Team: Jonathon Neighbors, MD as PCP - General (Family Medicine)  Hematological/Oncological History # ***  Interval History:  Paula Bentley 83 y.o. female with medical history significant for *** presents for a follow up visit. The patient's last visit was on ***. In the interim since the last visit ***  MEDICAL HISTORY:  Past Medical History:  Diagnosis Date   Glaucoma     SURGICAL HISTORY: Past Surgical History:  Procedure Laterality Date   ABDOMINAL HYSTERECTOMY     CHOLECYSTECTOMY     ERCP N/A 11/06/2020   Procedure: ENDOSCOPIC RETROGRADE CHOLANGIOPANCREATOGRAPHY (ERCP);  Surgeon: Ozell Blunt, MD;  Location: [facility];  Service: Endoscopy;  Laterality: N/A;   REMOVAL OF STONES  11/06/2020   Procedure: REMOVAL OF STONES;  Surgeon: Ozell Blunt, MD;  Location: [facility];  Service: Endoscopy;;   SPHINCTEROTOMY  11/06/2020   Procedure: [address];  Surgeon: Ozell Blunt, MD;  Location: [facility];  Service: Endoscopy;;    SOCIAL HISTORY: Social History   Socioeconomic History   Marital status: Widowed    Spouse name: Not on file   Number of children: Not on file   Years of education: Not on file   Highest education level: Not on file  Occupational History   Not on file  Tobacco Use   Smoking status: Never   Smokeless tobacco: Never  Vaping Use   Vaping status: Never Used  Substance and Sexual Activity   Alcohol use: Never   Drug use: Never   Sexual activity: Not on file  Other Topics Concern   Not on file  Social History Narrative   Not on file   Social Drivers of Health   Financial Resource Strain: Not on file  Food Insecurity: Not on file  Transportation Needs: Not on file  Physical Activity: Not on file  Stress: Not on file  Social Connections: Not on file  Intimate Partner Violence: Not on file    FAMILY HISTORY: Family History   Problem Relation Age of Onset   Diabetes Neg Hx     ALLERGIES:  has no known allergies.  MEDICATIONS:  Current Outpatient Medications  Medication Sig Dispense Refill   acetaminophen  (TYLENOL ) 325 MG tablet Take 650 mg by mouth every 6 (six) hours as needed for mild pain, fever or headache.     dorzolamide -timolol  (COSOPT ) 22.3-6.8 MG/ML ophthalmic solution Place 1 drop into both eyes 2 (two) times daily.     latanoprost  (XALATAN ) 0.005 % ophthalmic solution Place 1 drop into both eyes at bedtime.     losartan  (COZAAR ) 25 MG tablet Take 25 mg by mouth daily.     rivaroxaban  (XARELTO ) 10 MG TABS tablet Take 1 tablet (10 mg total) by mouth daily. (Patient taking differently: Take 5 mg by mouth daily.) 30 tablet 5   terbinafine (LAMISIL) 250 MG tablet Take 250 mg by mouth daily.     trolamine salicylate (ASPERCREME) 10 % cream Apply 1 application topically as needed for muscle pain.     No current facility-administered medications for this visit.    REVIEW OF SYSTEMS:   Constitutional: ( - ) fevers, ( - )  chills , ( - ) night sweats Eyes: ( - ) blurriness of vision, ( - ) double vision, ( - ) watery eyes Ears, nose, mouth, throat, and face: ( - ) mucositis, ( - ) sore throat Respiratory: ( - ) cough, ( - )  dyspnea, ( - ) wheezes Cardiovascular: ( - ) palpitation, ( - ) chest discomfort, ( - ) lower extremity swelling Gastrointestinal:  ( - ) nausea, ( - ) heartburn, ( - ) change in bowel habits Skin: ( - ) abnormal skin rashes Lymphatics: ( - ) new lymphadenopathy, ( - ) easy bruising Neurological: ( - ) numbness, ( - ) tingling, ( - ) new weaknesses Behavioral/Psych: ( - ) mood change, ( - ) new changes  All other systems were reviewed with the patient and are negative.  PHYSICAL EXAMINATION: ECOG PERFORMANCE STATUS: {CHL ONC ECOG [id number]}  There were no vitals filed for this visit. There were no vitals filed for this visit.  GENERAL: alert, no distress and  comfortable SKIN: skin color, texture, turgor are normal, no rashes or significant lesions EYES: conjunctiva are pink and non-injected, sclera clear OROPHARYNX: no exudate, no erythema; lips, buccal mucosa, and tongue normal  NECK: supple, non-tender LYMPH:  no palpable lymphadenopathy in the cervical, axillary or inguinal LUNGS: clear to auscultation and percussion with normal breathing effort HEART: regular rate & rhythm and no murmurs and no lower extremity edema ABDOMEN: soft, non-tender, non-distended, normal bowel sounds Musculoskeletal: no cyanosis of digits and no clubbing  PSYCH: alert & oriented x 3, fluent speech NEURO: no focal motor/sensory deficits  LABORATORY DATA:  I have reviewed the data as listed    Latest Ref Rng & Units 04/26/2023   10:26 AM 11/09/2020    5:07 AM 11/08/2020    2:44 AM  CBC  WBC 4.0 - 10.5 K/uL 5.0  7.9  10.5   Hemoglobin 12.0 - 15.0 g/dL 16.1  09.6  04.5   Hematocrit 36.0 - 46.0 % 40.3  36.4  35.9   Platelets 150 - 400 K/uL 156  119  127        Latest Ref Rng & Units 04/26/2023   10:26 AM 11/11/2020    5:14 AM 11/09/2020    5:07 AM  CMP  Glucose 70 - 99 mg/dL 409  811  99   BUN 8 - 23 mg/dL 15  10  15    Creatinine 0.44 - 1.00 mg/dL 9.14  7.82  9.56   Sodium 135 - 145 mmol/L 143  141  139   Potassium 3.5 - 5.1 mmol/L 3.8  3.8  3.6   Chloride 98 - 111 mmol/L 110  110  109   CO2 22 - 32 mmol/L 28  25  21    Calcium 8.9 - 10.3 mg/dL 9.6  8.9  8.9   Total Protein 6.5 - 8.1 g/dL 7.0  5.5  5.6   Total Bilirubin <1.2 mg/dL 0.6  0.4  1.1   Alkaline Phos 38 - 126 U/L 67  50  55   AST 15 - 41 U/L 12  31  26    ALT 0 - 44 U/L 6  33  46     No results found for: "MPROTEIN" No results found for: "KPAFRELGTCHN", "LAMBDASER", "KAPLAMBRATIO"   BLOOD FILM: *** Review of the peripheral blood smear showed normal appearing white cells with neutrophils that were appropriately lobated and granulated. There was no predominance of bi-lobed or  hyper-segmented neutrophils appreciated. No Dohle bodies were noted. There was no left shifting, immature forms or blasts noted. Lymphocytes remain normal in size without any predominance of large granular lymphocytes. Red cells show no anisopoikilocytosis, macrocytes , microcytes or polychromasia. There were no schistocytes, target cells, echinocytes, acanthocytes, dacrocytes, or stomatocytes.There was no rouleaux  formation, nucleated red cells, or intra-cellular inclusions noted. The platelets are normal in size, shape, and color without any clumping evident.  RADIOGRAPHIC STUDIES: I have personally reviewed the radiological images as listed and agreed with the findings in the report. No results found.  ASSESSMENT & PLAN ***  No orders of the defined types were placed in this encounter.   All questions were answered. The patient knows to call the clinic with any problems, questions or concerns.  A total of more than {CHL ONC TIME VISIT - [id number]} were spent on this encounter with face-to-face time and non-face-to-face time, including preparing to see the patient, ordering tests and/or medications, counseling the patient and coordination of care as outlined above.   Rogerio Clay, MD Department of Hematology/Oncology [facility] at [facility] Phone: [phone] Pager: [phone] Email: [email or web address]  10/26/2023 2:07 PM

## 2023-10-27 DIAGNOSIS — I10 Essential (primary) hypertension: Secondary | ICD-10-CM | POA: Diagnosis not present

## 2023-10-27 DIAGNOSIS — E782 Mixed hyperlipidemia: Secondary | ICD-10-CM | POA: Diagnosis not present

## 2023-10-27 DIAGNOSIS — B351 Tinea unguium: Secondary | ICD-10-CM | POA: Diagnosis not present

## 2023-10-27 DIAGNOSIS — I2601 Septic pulmonary embolism with acute cor pulmonale: Secondary | ICD-10-CM | POA: Diagnosis not present

## 2023-10-27 DIAGNOSIS — R6510 Systemic inflammatory response syndrome (SIRS) of non-infectious origin without acute organ dysfunction: Secondary | ICD-10-CM | POA: Diagnosis not present

## 2023-11-04 ENCOUNTER — Other Ambulatory Visit: Payer: Self-pay | Admitting: Hematology and Oncology

## 2023-11-07 ENCOUNTER — Telehealth: Payer: Self-pay | Admitting: Hematology and Oncology

## 2023-11-08 ENCOUNTER — Encounter: Payer: Self-pay | Admitting: Podiatry

## 2023-11-08 ENCOUNTER — Ambulatory Visit: Admitting: Podiatry

## 2023-11-08 DIAGNOSIS — M79674 Pain in right toe(s): Secondary | ICD-10-CM

## 2023-11-08 DIAGNOSIS — M79675 Pain in left toe(s): Secondary | ICD-10-CM

## 2023-11-08 DIAGNOSIS — B351 Tinea unguium: Secondary | ICD-10-CM

## 2023-11-08 NOTE — Progress Notes (Signed)
This patient presents to the office with chief complaint of long thick painful nails.  Patient says the nails are painful walking and wearing shoes.  This patient is unable to self treat.  This patient is unable to trim her nails since she is unable to reach her nails. She was prescribed lamisil by her PCP.  She also bought athlete foot medication . She presents to the office for preventative foot care services.  General Appearance  Alert, conversant and in no acute stress.  Vascular  Dorsalis pedis and posterior tibial  pulses are palpable  bilaterally.  Capillary return is within normal limits  bilaterally. Temperature is within normal limits  bilaterally.  Neurologic  Senn-Weinstein monofilament wire test within normal limits  bilaterally. Muscle power within normal limits bilaterally.  Nails Thick disfigured discolored nails with subungual debris  hallux nails bilaterally. No evidence of bacterial infection or drainage bilaterally.  Orthopedic  No limitations of motion  feet .  No crepitus or effusions noted.  No bony pathology or digital deformities noted.  Skin  normotropic skin with no porokeratosis noted bilaterally.  No signs of infections or ulcers noted.     Onychomycosis  Nails  B/L.  Pain in right toes  Pain in left toes  Debridement of nails both feet followed trimming the nails with dremel tool.    RTC 3 months.   Helane Gunther DPM

## 2023-11-13 NOTE — Progress Notes (Signed)
 [facility] [phone]   [phone]  PROGRESS NOTE  Patient Care Team: Benjamine Aland, MD as PCP - General (Family Medicine)  Hematological/Oncological History # History of Pulmonary Embolism 04/26/2023: establish care with Dr. Federico  Interval History:  Paula Bentley 83 y.o. female with medical history significant for pulmonary embolism on Xarelto  who  presents for a follow up visit. The patient's last visit was on 04/26/2023. In the interim since the last visit she has continued on Xarelto  therapy.   On exam today Mrs. Lumpkins-Bentley reports she has been well overall in the interim since her last visit.  She has had no major changes in her health.  She is still taking her Xarelto  10 mg p.o. daily without any difficulty.  She has had no trouble with bleeding, bruising, or dark stools.  She reports that she has paid extensive amount of the medication with initial $302 for the first dose and $47 a month thereafter.  She reports he is not having any signs or symptoms concerning for recurrent clot such as leg pain, leg swelling, chest pain, or shortness of breath.  She reports that she has no upcoming surgery or dental work.  Overall she is willing and able to continue on Xarelto  therapy at this time.  A full 10 point ROS is otherwise negative.  MEDICAL HISTORY:  Past Medical History:  Diagnosis Date   Glaucoma     SURGICAL HISTORY: Past Surgical History:  Procedure Laterality Date   ABDOMINAL HYSTERECTOMY     CHOLECYSTECTOMY     ERCP N/A 11/06/2020   Procedure: ENDOSCOPIC RETROGRADE CHOLANGIOPANCREATOGRAPHY (ERCP);  Surgeon: Rosalie Kitchens, MD;  Location: [facility];  Service: Endoscopy;  Laterality: N/A;   REMOVAL OF STONES  11/06/2020   Procedure: REMOVAL OF STONES;  Surgeon: Rosalie Kitchens, MD;  Location: [facility];  Service: Endoscopy;;   SPHINCTEROTOMY  11/06/2020   Procedure: ANNETT;  Surgeon: Rosalie Kitchens, MD;  Location: [facility];  Service: Endoscopy;;    SOCIAL HISTORY: Social History   Socioeconomic History   Marital status: Widowed    Spouse name: Not on file   Number of children: Not on file   Years of education: Not on file   Highest education level: Not on file  Occupational History   Not on file  Tobacco Use   Smoking status: Never   Smokeless tobacco: Never  Vaping Use   Vaping status: Never Used  Substance and Sexual Activity   Alcohol use: Never   Drug use: Never   Sexual activity: Not on file  Other Topics Concern   Not on file  Social History Narrative   Not on file   Social Drivers of Health   Financial Resource Strain: Not on file  Food Insecurity: Not on file  Transportation Needs: Not on file  Physical Activity: Not on file  Stress: Not on file  Social Connections: Not on file  Intimate Partner Violence: Not on file    FAMILY HISTORY: Family History  Problem Relation Age of Onset   Diabetes Neg Hx     ALLERGIES:  has no known allergies.  MEDICATIONS:  Current Outpatient Medications  Medication Sig Dispense Refill   acetaminophen  (TYLENOL ) 325 MG tablet Take 650 mg by mouth every 6 (six) hours as needed for mild pain, fever or headache.     dorzolamide -timolol  (COSOPT ) 22.3-6.8 MG/ML ophthalmic solution Place 1 drop into both eyes 2 (two) times daily.     latanoprost  (XALATAN ) 0.005 %  ophthalmic solution Place 1 drop into both eyes at bedtime.     losartan  (COZAAR ) 25 MG tablet Take 25 mg by mouth daily.     rivaroxaban  (XARELTO ) 10 MG TABS tablet Take 1 tablet (10 mg total) by mouth daily. 30 tablet 0   terbinafine (LAMISIL) 250 MG tablet Take 250 mg by mouth daily.     trolamine salicylate (ASPERCREME) 10 % cream Apply 1 application topically as needed for muscle pain.     No current facility-administered medications for this visit.    REVIEW OF SYSTEMS:   Constitutional: ( - ) fevers, ( - )  chills , ( - ) night sweats Eyes: ( - ) blurriness of vision,  ( - ) double vision, ( - ) watery eyes Ears, nose, mouth, throat, and face: ( - ) mucositis, ( - ) sore throat Respiratory: ( - ) cough, ( - ) dyspnea, ( - ) wheezes Cardiovascular: ( - ) palpitation, ( - ) chest discomfort, ( - ) lower extremity swelling Gastrointestinal:  ( - ) nausea, ( - ) heartburn, ( - ) change in bowel habits Skin: ( - ) abnormal skin rashes Lymphatics: ( - ) new lymphadenopathy, ( - ) easy bruising Neurological: ( - ) numbness, ( - ) tingling, ( - ) new weaknesses Behavioral/Psych: ( - ) mood change, ( - ) new changes  All other systems were reviewed with the patient and are negative.  PHYSICAL EXAMINATION:  There were no vitals filed for this visit. There were no vitals filed for this visit.  GENERAL: Elderly African-American female, alert, no distress and comfortable SKIN: skin color, texture, turgor are normal, no rashes or significant lesions EYES: conjunctiva are pink and non-injected, sclera clear LUNGS: clear to auscultation and percussion with normal breathing effort HEART: regular rate & rhythm and no murmurs and no lower extremity edema Musculoskeletal: no cyanosis of digits and no clubbing  PSYCH: alert & oriented x 3, fluent speech NEURO: no focal motor/sensory deficits  LABORATORY DATA:  I have reviewed the data as listed    Latest Ref Rng & Units 04/26/2023   10:26 AM 11/09/2020    5:07 AM 11/08/2020    2:44 AM  CBC  WBC 4.0 - 10.5 K/uL 5.0  7.9  10.5   Hemoglobin 12.0 - 15.0 g/dL 86.6  87.9  87.7   Hematocrit 36.0 - 46.0 % 40.3  36.4  35.9   Platelets 150 - 400 K/uL 156  119  127        Latest Ref Rng & Units 04/26/2023   10:26 AM 11/11/2020    5:14 AM 11/09/2020    5:07 AM  CMP  Glucose 70 - 99 mg/dL 887  877  99   BUN 8 - 23 mg/dL 15  10  15    Creatinine 0.44 - 1.00 mg/dL 9.22  9.37  9.27   Sodium 135 - 145 mmol/L 143  141  139   Potassium 3.5 - 5.1 mmol/L 3.8  3.8  3.6   Chloride 98 - 111 mmol/L 110  110  109   CO2 22 - 32  mmol/L 28  25  21    Calcium 8.9 - 10.3 mg/dL 9.6  8.9  8.9   Total Protein 6.5 - 8.1 g/dL 7.0  5.5  5.6   Total Bilirubin <1.2 mg/dL 0.6  0.4  1.1   Alkaline Phos 38 - 126 U/L 67  50  55   AST 15 - 41 U/L 12  31  26   ALT 0 - 44 U/L 6  33  46    RADIOGRAPHIC STUDIES: No results found.  ASSESSMENT & PLAN Biridiana Lumpkins Effie 83 y.o. female with medical history significant for glaucoma who presents for evaluation of a history of pulmonary embolism, currently on chronic anticoagulation.    After review of the labs, review of the records, and discussion with the patient the patients findings are most consistent with a pulmonary embolism of unclear etiology.  Patient does not remember the circumstances around the time she developed a blood clot and there are no records available from the time of the blood clot.  Therefore we would need to assume that this is an unprovoked VTE while we are trying to find the root cause in outside records.   A provoked venous thromboembolism (VTE) is one that has a clear inciting factor or event. Provoking factors include prolonged travel/immobility, surgery (particular abdominal or orthropedic), trauma,  and pregnancy/ estrogen containing birth control. After a detailed history and review of the records there is no clear provoking factor for this patient's VTE.  Patients with unprovoked VTEs have up to 25% recurrence after 5 years and 36% at 10 years, with 4% of these clots being fatal (BMJ?2019;366:l4363). Therefore the formal recommendation for unprovoked VTE's is lifelong anticoagulation, as the cause may not be transient or reversible. We recommend 6 months or full strength anticoagulation with a re-evaluation after that time.  The patient's will then have a choice of maintenance dose DOAC (preferred, recommended), 81mg  ASA PO daily (non-preferred), or no further anticoagulation (not recommended).     #Pulmonary Embolism, Unknown/Unclear Etiology  # Reported  Septic Emboli  --findings at this time are consistent with a provoked VTE, as a septic emboli is of infectious etiology and would therefore considered provoked.  Would need outside records in order to verify. --Unable to find records via Care Everywhere.  Patient thinks that she was seen at [facility] [place] .  We will work on tracking down these records. --labs today show WBC 5.1, Hgb 13.3, MCV 92.1, Plt 146.  Creatinine LFTs within normal limits --continue Xarelto  to 10 mg p.o. daily as a maintenance dose. --patient denies any bleeding, bruising, or dark stools on this medication. It is well tolerated. No difficulties accessing/affording the medication  --RTC placeholder in 6 months.     No orders of the defined types were placed in this encounter.   All questions were answered. The patient knows to call the clinic with any problems, questions or concerns.  A total of more than 30 minutes were spent on this encounter with face-to-face time and non-face-to-face time, including preparing to see the patient, ordering tests and/or medications, counseling the patient and coordination of care as outlined above.   Norleen IVAR Kidney, MD Department of Hematology/Oncology [facility] Phone: [phone] Pager: [phone] Email: [email or web address]  11/13/2023 8:25 PM

## 2023-11-14 ENCOUNTER — Inpatient Hospital Stay: Attending: Hematology and Oncology

## 2023-11-14 ENCOUNTER — Inpatient Hospital Stay: Admitting: Hematology and Oncology

## 2023-11-14 VITALS — BP 123/73 | HR 46 | Temp 97.9°F | Resp 15 | Wt 177.3 lb

## 2023-11-14 DIAGNOSIS — I2699 Other pulmonary embolism without acute cor pulmonale: Secondary | ICD-10-CM | POA: Insufficient documentation

## 2023-11-14 DIAGNOSIS — Z86711 Personal history of pulmonary embolism: Secondary | ICD-10-CM

## 2023-11-14 DIAGNOSIS — Z7901 Long term (current) use of anticoagulants: Secondary | ICD-10-CM | POA: Insufficient documentation

## 2023-11-14 LAB — CBC WITH DIFFERENTIAL (CANCER CENTER ONLY)
Abs Immature Granulocytes: 0.01 10*3/uL (ref 0.00–0.07)
Basophils Absolute: 0 10*3/uL (ref 0.0–0.1)
Basophils Relative: 0 %
Eosinophils Absolute: 0.1 10*3/uL (ref 0.0–0.5)
Eosinophils Relative: 2 %
HCT: 39.5 % (ref 36.0–46.0)
Hemoglobin: 13.3 g/dL (ref 12.0–15.0)
Immature Granulocytes: 0 %
Lymphocytes Relative: 33 %
Lymphs Abs: 1.7 10*3/uL (ref 0.7–4.0)
MCH: 31 pg (ref 26.0–34.0)
MCHC: 33.7 g/dL (ref 30.0–36.0)
MCV: 92.1 fL (ref 80.0–100.0)
Monocytes Absolute: 0.6 10*3/uL (ref 0.1–1.0)
Monocytes Relative: 11 %
Neutro Abs: 2.7 10*3/uL (ref 1.7–7.7)
Neutrophils Relative %: 54 %
Platelet Count: 146 10*3/uL — ABNORMAL LOW (ref 150–400)
RBC: 4.29 MIL/uL (ref 3.87–5.11)
RDW: 13.7 % (ref 11.5–15.5)
WBC Count: 5.1 10*3/uL (ref 4.0–10.5)
nRBC: 0 % (ref 0.0–0.2)

## 2023-11-14 LAB — CMP (CANCER CENTER ONLY)
ALT: 6 U/L (ref 0–44)
AST: 18 U/L (ref 15–41)
Albumin: 4.1 g/dL (ref 3.5–5.0)
Alkaline Phosphatase: 59 U/L (ref 38–126)
Anion gap: 6 (ref 5–15)
BUN: 18 mg/dL (ref 8–23)
CO2: 26 mmol/L (ref 22–32)
Calcium: 9.6 mg/dL (ref 8.9–10.3)
Chloride: 111 mmol/L (ref 98–111)
Creatinine: 0.77 mg/dL (ref 0.44–1.00)
GFR, Estimated: >60 mL/min (ref >60)
Glucose, Bld: 91 mg/dL (ref 70–99)
Potassium: 4.7 mmol/L (ref 3.5–5.1)
Sodium: 143 mmol/L (ref 135–145)
Total Bilirubin: 0.5 mg/dL (ref 0.0–1.2)
Total Protein: 7.1 g/dL (ref 6.5–8.1)

## 2023-11-25 ENCOUNTER — Telehealth: Payer: Self-pay | Admitting: *Deleted

## 2023-11-25 NOTE — Telephone Encounter (Signed)
 TCT patient after receiving vm message from her requesting a call back. No specific concerns mentioned. No answer but was able to leave vm message for her to call back on Monday, 11/28/23.

## 2023-12-04 ENCOUNTER — Other Ambulatory Visit: Payer: Self-pay | Admitting: Hematology and Oncology

## 2023-12-23 DIAGNOSIS — I10 Essential (primary) hypertension: Secondary | ICD-10-CM | POA: Diagnosis not present

## 2023-12-23 DIAGNOSIS — R7303 Prediabetes: Secondary | ICD-10-CM | POA: Diagnosis not present

## 2023-12-23 DIAGNOSIS — E782 Mixed hyperlipidemia: Secondary | ICD-10-CM | POA: Diagnosis not present

## 2023-12-23 DIAGNOSIS — Z86711 Personal history of pulmonary embolism: Secondary | ICD-10-CM | POA: Diagnosis not present

## 2023-12-23 DIAGNOSIS — R6510 Systemic inflammatory response syndrome (SIRS) of non-infectious origin without acute organ dysfunction: Secondary | ICD-10-CM | POA: Diagnosis not present

## 2023-12-26 ENCOUNTER — Telehealth: Payer: Self-pay | Admitting: *Deleted

## 2023-12-26 NOTE — Telephone Encounter (Signed)
 Received vm message from pt requesting a call back.  TCT patient and spoke with her. She states she is calling about her medical records from [place] . She is asking what she needs to do to get them to our office. Advised that she should contact the medical records dept at her previous doctor's office in [place]  to request records be sent to our office. Pt voiced understanding.

## 2023-12-28 ENCOUNTER — Encounter: Payer: Self-pay | Admitting: Family Medicine

## 2024-01-03 ENCOUNTER — Other Ambulatory Visit: Payer: Self-pay | Admitting: Hematology and Oncology

## 2024-01-30 ENCOUNTER — Encounter (HOSPITAL_COMMUNITY): Payer: Self-pay | Admitting: Emergency Medicine

## 2024-01-30 ENCOUNTER — Emergency Department (HOSPITAL_COMMUNITY)
Admission: EM | Admit: 2024-01-30 | Discharge: 2024-01-30 | Disposition: A | Discharge status: Home / Self Care | Attending: Student in an Organized Health Care Education/Training Program | Admitting: Student in an Organized Health Care Education/Training Program

## 2024-01-30 ENCOUNTER — Emergency Department (HOSPITAL_COMMUNITY)

## 2024-01-30 DIAGNOSIS — K5732 Diverticulitis of large intestine without perforation or abscess without bleeding: Secondary | ICD-10-CM | POA: Diagnosis not present

## 2024-01-30 DIAGNOSIS — Z7901 Long term (current) use of anticoagulants: Secondary | ICD-10-CM | POA: Diagnosis not present

## 2024-01-30 DIAGNOSIS — R109 Unspecified abdominal pain: Secondary | ICD-10-CM | POA: Diagnosis not present

## 2024-01-30 DIAGNOSIS — K449 Diaphragmatic hernia without obstruction or gangrene: Secondary | ICD-10-CM | POA: Diagnosis not present

## 2024-01-30 DIAGNOSIS — Z9049 Acquired absence of other specified parts of digestive tract: Secondary | ICD-10-CM | POA: Diagnosis not present

## 2024-01-30 DIAGNOSIS — K5792 Diverticulitis of intestine, part unspecified, without perforation or abscess without bleeding: Secondary | ICD-10-CM | POA: Insufficient documentation

## 2024-01-30 DIAGNOSIS — Z79899 Other long term (current) drug therapy: Secondary | ICD-10-CM | POA: Insufficient documentation

## 2024-01-30 LAB — COMPREHENSIVE METABOLIC PANEL WITH GFR
ALT: 10 U/L (ref 0–44)
AST: 17 U/L (ref 15–41)
Albumin: 3.2 g/dL — ABNORMAL LOW (ref 3.5–5.0)
Alkaline Phosphatase: 52 U/L (ref 38–126)
Anion gap: 10 (ref 5–15)
BUN: 9 mg/dL (ref 8–23)
CO2: 23 mmol/L (ref 22–32)
Calcium: 9 mg/dL (ref 8.9–10.3)
Chloride: 105 mmol/L (ref 98–111)
Creatinine, Ser: 0.7 mg/dL (ref 0.44–1.00)
GFR, Estimated: >60 mL/min (ref >60)
Glucose, Bld: 124 mg/dL — ABNORMAL HIGH (ref 70–99)
Potassium: 4.1 mmol/L (ref 3.5–5.1)
Sodium: 138 mmol/L (ref 135–145)
Total Bilirubin: 0.7 mg/dL (ref 0.0–1.2)
Total Protein: 6.8 g/dL (ref 6.5–8.1)

## 2024-01-30 LAB — CBC
HCT: 41 % (ref 36.0–46.0)
Hemoglobin: 13.5 g/dL (ref 12.0–15.0)
MCH: 30.6 pg (ref 26.0–34.0)
MCHC: 32.9 g/dL (ref 30.0–36.0)
MCV: 93 fL (ref 80.0–100.0)
Platelets: 173 K/uL (ref 150–400)
RBC: 4.41 MIL/uL (ref 3.87–5.11)
RDW: 13.1 % (ref 11.5–15.5)
WBC: 7.7 K/uL (ref 4.0–10.5)
nRBC: 0 % (ref 0.0–0.2)

## 2024-01-30 LAB — URINALYSIS, ROUTINE W REFLEX MICROSCOPIC
Bilirubin Urine: NEGATIVE
Glucose, UA: NEGATIVE mg/dL
Ketones, ur: NEGATIVE mg/dL
Nitrite: NEGATIVE
Protein, ur: NEGATIVE mg/dL
Specific Gravity, Urine: 1.017 (ref 1.005–1.030)
pH: 7 (ref 5.0–8.0)

## 2024-01-30 LAB — LACTIC ACID, PLASMA: Lactic Acid, Venous: 1 mmol/L (ref 0.5–1.9)

## 2024-01-30 LAB — LIPASE, BLOOD: Lipase: 22 U/L (ref 11–51)

## 2024-01-30 MED ORDER — IOHEXOL 350 MG/ML SOLN
75.0000 mL | Freq: Once | INTRAVENOUS | Status: AC | PRN
  Administered 2024-01-30: 75 mL via INTRAVENOUS

## 2024-01-30 MED ORDER — SODIUM CHLORIDE 0.9 % IV SOLN
2.0000 g | Freq: Once | INTRAVENOUS | Status: AC
  Administered 2024-01-30: 2 g via INTRAVENOUS
  Filled 2024-01-30: qty 20

## 2024-01-30 MED ORDER — AMOXICILLIN-POT CLAVULANATE 875-125 MG PO TABS: 1.0000 | ORAL_TABLET | Freq: Two times a day (BID) | ORAL | 0 refills | Status: AC

## 2024-01-30 NOTE — ED Notes (Signed)
 Patient transported to CT

## 2024-01-30 NOTE — ED Provider Notes (Signed)
 [place] EMERGENCY DEPARTMENT AT [facility] Provider Note   CSN: [id number] Arrival date & time: 01/30/24  1034     Patient presents with: Abdominal Pain   Paula Bentley is a 83 y.o. female.   83 year old female with a past medical history of pulmonary embolism on Xarelto  and hysterectomy presents to the emergency department with a right sided abdominal pain that began yesterday.  She reports right-sided abdominal pain that is tender to touch and worse with movement.  When she lays flat and still she does not have any symptoms.  She reports decreased urination and she has not had a bowel movement since the pain began.  She also is unsure if she is passing any flatulence.  She denies any back pain, fevers, nausea, vomiting, or recent trauma.   Abdominal Pain      Prior to Admission medications   Medication Sig Start Date End Date Taking? Authorizing Provider  amoxicillin -clavulanate (AUGMENTIN ) 875-125 MG tablet Take 1 tablet by mouth every 12 (twelve) hours. 01/30/24  Yes Tanylah Schnoebelen, DO  acetaminophen  (TYLENOL ) 325 MG tablet Take 650 mg by mouth every 6 (six) hours as needed for mild pain, fever or headache.    [provider]  dorzolamide -timolol  (COSOPT ) 22.3-6.8 MG/ML ophthalmic solution Place 1 drop into both eyes 2 (two) times daily. 09/12/20   [provider]  latanoprost  (XALATAN ) 0.005 % ophthalmic solution Place 1 drop into both eyes at bedtime. 11/01/20   [provider]  losartan  (COZAAR ) 25 MG tablet Take 25 mg by mouth daily. 09/29/20   [provider]  terbinafine (LAMISIL) 250 MG tablet Take 250 mg by mouth daily. Patient not taking: Reported on 11/14/2023 12/24/22   [provider]  trolamine salicylate (ASPERCREME) 10 % cream Apply 1 application topically as needed for muscle pain.    [provider]  XARELTO  10 MG TABS tablet TAKE 1 TABLET(10 MG) BY MOUTH DAILY 01/03/24   Dorsey, John T IV, MD     Allergies: Patient has no known allergies.    Review of Systems  Gastrointestinal:  Positive for abdominal pain.  All other systems reviewed and are negative.   Updated Vital Signs BP (!) 142/65   Pulse (!) 51   Temp (!) 97.4 F (36.3 C)   Resp 18   SpO2 100%   Physical Exam Vitals and nursing note reviewed.  Constitutional:      General: She is not in acute distress. HENT:     Head: Normocephalic.  Cardiovascular:     Rate and Rhythm: Normal rate.  Pulmonary:     Effort: Pulmonary effort is normal.  Abdominal:     General: There is distension.     Palpations: Abdomen is soft.     Tenderness: There is abdominal tenderness in the right lower quadrant. There is guarding. There is no right CVA tenderness, left CVA tenderness or rebound.  Skin:    General: Skin is warm.     Capillary Refill: Capillary refill takes less than 2 seconds.  Neurological:     Mental Status: She is alert and oriented to person, place, and time.     (all labs ordered are listed, but only abnormal results are displayed) Labs Reviewed  COMPREHENSIVE METABOLIC PANEL WITH GFR - Abnormal; Notable for the following components:      Result Value   Glucose, Bld 124 (*)    Albumin 3.2 (*)    All other components within normal limits  URINALYSIS, ROUTINE  W REFLEX MICROSCOPIC - Abnormal; Notable for the following components:   Color, Urine AMBER (*)    APPearance CLOUDY (*)    Hgb urine dipstick SMALL (*)    Leukocytes,Ua MODERATE (*)    Bacteria, UA MANY (*)    Non Squamous Epithelial 0-5 (*)    All other components within normal limits  URINE CULTURE  LIPASE, BLOOD  CBC  LACTIC ACID, PLASMA    EKG: None  Radiology: CT ABDOMEN PELVIS W CONTRAST Result Date: 01/30/2024 CLINICAL DATA:  Right-sided abdominal pain EXAM: CT ABDOMEN AND PELVIS WITH CONTRAST TECHNIQUE: Multidetector CT imaging of the abdomen and pelvis was performed using the standard protocol following bolus administration of  intravenous contrast. RADIATION DOSE REDUCTION: This exam was performed according to the departmental dose-optimization program which includes automated exposure control, adjustment of the mA and/or kV according to patient size and/or use of iterative reconstruction technique. CONTRAST:  75mL OMNIPAQUE  IOHEXOL  350 MG/ML SOLN COMPARISON:  September 15, 2021 MRI and prior studies pole FINDINGS: Lower chest: Scarring/atelectasis at the bilateral lung bases. Hepatobiliary: Pneumobilia. Gallbladder is surgically absent with prominent common bile duct, similar to prior Pancreas: Unremarkable. Spleen: Unremarkable. Adrenals/Urinary Tract: Adrenal glands are unremarkable. No nephrolithiasis or hydronephrosis. Likely left parapelvic cysts. Stomach/Bowel: Small hiatal hernia. Extensive colonic diverticulosis with focal colonic wall thickening and inflammation about diverticula in the proximal transverse colon near the hepatic flexure. No free air. No loculated fluid collections. Appendix is unremarkable. Vascular/Lymphatic: Normal caliber aorta. No lymphadenopathy by size criteria. Reproductive: Uterus is surgically absent.  No adnexal masses. Other: Trace volume pelvic free fluid. Musculoskeletal: No acute osseous findings. IMPRESSION: Acute diverticulitis of the proximal transverse colon. Colonoscopy should be considered in 6-8 weeks after resolution of acute symptoms to exclude underlying lesion as clinically indicated. Electronically Signed   By: Michaeline Blanch M.D.   On: 01/30/2024 12:52     Procedures   Medications Ordered in the ED  cefTRIAXone  (ROCEPHIN ) 2 g in sodium chloride  0.9 % 100 mL IVPB (2 g Intravenous New Bag/Given 01/30/24 1328)  iohexol  (OMNIPAQUE ) 350 MG/ML injection 75 mL (75 mLs Intravenous Contrast Given 01/30/24 1234)                                    Medical Decision Making This patient presents to the ED for concern regarding abdominal pain. This complaint can involve an extensive number of  treatment options and could carry with it a risk of complications (including morbidity). The differential diagnosis includes but not limited to small bowel obstruction, perforation, acute appendicitis, kidney stone, and others  External records from outside source obtained if available and applicable  Reviewed any available information if applicable including prior office visits, ED visits, or hospitalizations.   Lab Tests: I Ordered, and personally interpreted labs.  The pertinent results include:  Normal white blood cell count and lactic acid.  UA shows some bacteria and leukocyte Estrace   Imaging Studies ordered: I ordered imaging studies including CT scan of the abdomen/pelvis with contrast. I independently visualized and I agree with the radiologist interpretation. Imaging results:  CT of the abdomen and pelvis shows evidence of acute diverticulitis of the proximal transverse colon without evidence of perforation or free air.   Problem List / ED Course:      Abdominal pain: 83 year old female with 2 days of right sided abdominal discomfort.  Unclear if she has associated decreased urination/decreased bowel  movements.  She is in no acute distress laying in bed.  She is tender to palpation and states movement increases her symptoms.  Lab work and imaging ordered to rule out any acute process. Imaging did come back positive for diverticulitis without any evidence of perforation.  A dose of Rocephin  was provided in the ED for underlying diverticulitis/UTI.  Patient will be discharged with antibiotics and close follow-up.   Reevaluation: After the interventions noted above, I reevaluated the patient and found that they have :improved  Dispostion: After consideration of diagnostic results and the patient's current presentation, it was determined that the patient was stable for DC.  Return precautions were discussed.  All questions answered.   Amount and/or Complexity of Data Reviewed Labs:  ordered. Radiology: ordered.  Risk Prescription drug management.   Final diagnoses:  Diverticulitis    ED Discharge Orders          Ordered    amoxicillin -clavulanate (AUGMENTIN ) 875-125 MG tablet  Every 12 hours        01/30/24 1349               Mickey Esguerra, DO 01/30/24 1349

## 2024-01-30 NOTE — ED Notes (Signed)
Lab to add on urine culture

## 2024-01-30 NOTE — Discharge Instructions (Signed)
 You were seen in the emergency department today due to your abdominal pain.  You are found to have evidence of diverticulitis.  Please take your antibiotics as prescribed.  Return to the ED if you have any further concerns or questions.

## 2024-01-30 NOTE — ED Triage Notes (Signed)
 C/o right sided abd. Pain denies N/v/d onset  yest ,denies fever

## 2024-01-30 NOTE — ED Notes (Signed)
 Pt back from CT

## 2024-02-01 LAB — URINE CULTURE: Culture: >=100000 — AB

## 2024-02-02 ENCOUNTER — Telehealth (HOSPITAL_BASED_OUTPATIENT_CLINIC_OR_DEPARTMENT_OTHER): Payer: Self-pay | Admitting: *Deleted

## 2024-02-02 NOTE — Telephone Encounter (Signed)
 Post ED Visit - Positive Culture Follow-up  Culture report reviewed by antimicrobial stewardship pharmacist: [facility] [x]  Elma Fail, Pharm.D. []  Venetia Gully, Pharm.D., BCPS AQ-ID []  Garrel Crews, Pharm.D., BCPS []  Almarie Lunger, Pharm.D., BCPS []  [place], [address].D., BCPS, AAHIVP []  Rosaline Bihari, Pharm.D., BCPS, AAHIVP []  Vernell Meier, PharmD, BCPS []  Latanya Hint, PharmD, BCPS []  Donald Medley, PharmD, BCPS []  Rocky Bold, PharmD []  Dorothyann Alert, PharmD, BCPS []  Morene Babe, PharmD  [facility] []  Rosaline Edison, PharmD []  Romona Bliss, PharmD []  Dolphus Roller, PharmD []  Veva Seip, Rph []  Vernell Daunt) Leonce, PharmD []  Eva Allis, PharmD []  Rosaline Millet, PharmD []  Iantha Batch, PharmD []  Arvin Gauss, PharmD []  Wanda Hasting, PharmD []  Ronal Rav, PharmD []  Rocky Slade, PharmD []  Bard Jeans, PharmD   Positive urine culture Treated with Amoxicillin , organism sensitive to the same and no further patient follow-up is required at this time.  Jama Lisle Blondie 02/02/2024, 11:25 AM

## 2024-02-05 ENCOUNTER — Other Ambulatory Visit: Payer: Self-pay | Admitting: Hematology and Oncology

## 2024-02-09 ENCOUNTER — Encounter: Payer: Self-pay | Admitting: Podiatry

## 2024-02-09 ENCOUNTER — Ambulatory Visit (INDEPENDENT_AMBULATORY_CARE_PROVIDER_SITE_OTHER): Admitting: Podiatry

## 2024-02-09 DIAGNOSIS — M79675 Pain in left toe(s): Secondary | ICD-10-CM

## 2024-02-09 DIAGNOSIS — B351 Tinea unguium: Secondary | ICD-10-CM

## 2024-02-09 DIAGNOSIS — M79674 Pain in right toe(s): Secondary | ICD-10-CM

## 2024-02-09 NOTE — Progress Notes (Addendum)
 This patient presents to the office with chief complaint of long thick painful nails.  Patient says the nails are painful walking and wearing shoes.  This patient is unable to self treat.  This patient is unable to trim her nails since she is unable to reach her nail  She presents to the office for preventative foot care services.  General Appearance  Alert, conversant and in no acute stress.  Vascular  Dorsalis pedis and posterior tibial  pulses are palpable  bilaterally.  Capillary return is within normal limits  bilaterally. Temperature is within normal limits  bilaterally.  Neurologic  Senn-Weinstein monofilament wire test within normal limits  bilaterally. Muscle power within normal limits bilaterally.  Nails Thick disfigured discolored nails with subungual debris  hallux nails bilaterally. No evidence of bacterial infection or drainage bilaterally.  Orthopedic  No limitations of motion  feet .  No crepitus or effusions noted.  No bony pathology or digital deformities noted.  Skin  normotropic skin with no porokeratosis noted bilaterally.  No signs of infections or ulcers noted.     Onychomycosis  Nails  B/L.  Pain in right toes  Pain in left toes  Debridement of nails both feet followed trimming the nails with dremel tool.    RTC 3 months.   Cordella Bold DPM

## 2024-02-20 DIAGNOSIS — Z1231 Encounter for screening mammogram for malignant neoplasm of breast: Secondary | ICD-10-CM | POA: Diagnosis not present

## 2024-03-07 ENCOUNTER — Other Ambulatory Visit: Payer: Self-pay | Admitting: Hematology and Oncology

## 2024-04-04 ENCOUNTER — Other Ambulatory Visit: Payer: Self-pay | Admitting: Hematology and Oncology

## 2024-05-10 ENCOUNTER — Ambulatory Visit: Admitting: Podiatry

## 2024-05-10 ENCOUNTER — Encounter: Payer: Self-pay | Admitting: Podiatry

## 2024-05-10 DIAGNOSIS — M79674 Pain in right toe(s): Secondary | ICD-10-CM | POA: Diagnosis not present

## 2024-05-10 DIAGNOSIS — M79675 Pain in left toe(s): Secondary | ICD-10-CM

## 2024-05-10 DIAGNOSIS — B351 Tinea unguium: Secondary | ICD-10-CM

## 2024-05-10 NOTE — Progress Notes (Signed)
 This patient presents to the office with chief complaint of long thick painful nails.  Patient says the nails are painful walking and wearing shoes.  This patient is unable to self treat.  This patient is unable to trim her nails since she is unable to reach her nail  She presents to the office for preventative foot care services.  General Appearance  Alert, conversant and in no acute stress.  Vascular  Dorsalis pedis and posterior tibial  pulses are palpable  bilaterally.  Capillary return is within normal limits  bilaterally. Temperature is within normal limits  bilaterally.  Neurologic  Senn-Weinstein monofilament wire test within normal limits  bilaterally. Muscle power within normal limits bilaterally.  Nails Thick disfigured discolored nails with subungual debris  hallux nails bilaterally. No evidence of bacterial infection or drainage bilaterally.  Orthopedic  No limitations of motion  feet .  No crepitus or effusions noted.  No bony pathology or digital deformities noted.  Skin  normotropic skin with no porokeratosis noted bilaterally.  No signs of infections or ulcers noted.     Onychomycosis  Nails  B/L.  Pain in right toes  Pain in left toes  Debridement of nails both feet followed trimming the nails with dremel tool.    RTC 3 months.   Cordella Bold DPM

## 2024-05-14 ENCOUNTER — Inpatient Hospital Stay: Attending: Hematology and Oncology

## 2024-05-14 ENCOUNTER — Other Ambulatory Visit: Payer: Self-pay | Admitting: Hematology and Oncology

## 2024-05-14 ENCOUNTER — Ambulatory Visit: Admitting: Hematology and Oncology

## 2024-05-14 VITALS — BP 100/64 | HR 53 | Temp 97.2°F | Resp 13 | Wt 175.7 lb

## 2024-05-14 DIAGNOSIS — Z86711 Personal history of pulmonary embolism: Secondary | ICD-10-CM

## 2024-05-14 DIAGNOSIS — Z7901 Long term (current) use of anticoagulants: Secondary | ICD-10-CM | POA: Diagnosis not present

## 2024-05-14 LAB — CMP (CANCER CENTER ONLY)
ALT: 6 U/L (ref 0–44)
AST: 17 U/L (ref 15–41)
Albumin: 4.1 g/dL (ref 3.5–5.0)
Alkaline Phosphatase: 81 U/L (ref 38–126)
Anion gap: 10 (ref 5–15)
BUN: 17 mg/dL (ref 8–23)
CO2: 26 mmol/L (ref 22–32)
Calcium: 9.7 mg/dL (ref 8.9–10.3)
Chloride: 106 mmol/L (ref 98–111)
Creatinine: 0.78 mg/dL (ref 0.44–1.00)
GFR, Estimated: >60 mL/min (ref >60)
Glucose, Bld: 125 mg/dL — ABNORMAL HIGH (ref 70–99)
Potassium: 4.1 mmol/L (ref 3.5–5.1)
Sodium: 141 mmol/L (ref 135–145)
Total Bilirubin: 0.8 mg/dL (ref 0.0–1.2)
Total Protein: 7.1 g/dL (ref 6.5–8.1)

## 2024-05-14 LAB — CBC WITH DIFFERENTIAL (CANCER CENTER ONLY)
Abs Immature Granulocytes: 0.01 K/uL (ref 0.00–0.07)
Basophils Absolute: 0 K/uL (ref 0.0–0.1)
Basophils Relative: 1 %
Eosinophils Absolute: 0.1 K/uL (ref 0.0–0.5)
Eosinophils Relative: 3 %
HCT: 40 % (ref 36.0–46.0)
Hemoglobin: 13.6 g/dL (ref 12.0–15.0)
Immature Granulocytes: 0 %
Lymphocytes Relative: 36 %
Lymphs Abs: 1.6 K/uL (ref 0.7–4.0)
MCH: 31 pg (ref 26.0–34.0)
MCHC: 34 g/dL (ref 30.0–36.0)
MCV: 91.1 fL (ref 80.0–100.0)
Monocytes Absolute: 0.4 K/uL (ref 0.1–1.0)
Monocytes Relative: 9 %
Neutro Abs: 2.4 K/uL (ref 1.7–7.7)
Neutrophils Relative %: 51 %
Platelet Count: 149 K/uL — ABNORMAL LOW (ref 150–400)
RBC: 4.39 MIL/uL (ref 3.87–5.11)
RDW: 14.1 % (ref 11.5–15.5)
WBC Count: 4.6 K/uL (ref 4.0–10.5)
nRBC: 0 % (ref 0.0–0.2)

## 2024-05-14 NOTE — Progress Notes (Signed)
 [facility] [phone]   [phone]  PROGRESS NOTE  Patient Care Team: Benjamine Aland, MD as PCP - General (Family Medicine)  Hematological/Oncological History # History of Pulmonary Embolism 04/26/2023: establish care with Dr. Federico  Interval History:  Paula Bentley 83 y.o. female with medical history significant for pulmonary embolism on Xarelto  who  presents for a follow up visit. The patient's last visit was on 11/14/2023. In the interim since the last visit she has continued on Xarelto  therapy.   On exam today Paula Bentley reports she has been well overall in the room since her last visit with no major changes in her health.  She reports that she is tolerating her Xarelto  10 mg p.o. daily well with no bleeding, bruising, or dark stools.  She has no signs or symptoms concerning for recurrent VTE.  She denies any chest pain, leg swelling, or shortness of breath.  She reports he is paying $47 a month for the medication is able to afford it without difficulty.  She is taking it faithfully daily.  She has no upcoming surgeries or dental work.  She reports her energy levels are about a 7 out of 10 today.  She reports her appetite remains good.  Overall she feels well and has no additional questions concerns or complaints.  A full 10 point ROS is otherwise negative.  MEDICAL HISTORY:  Past Medical History:  Diagnosis Date   Glaucoma     SURGICAL HISTORY: Past Surgical History:  Procedure Laterality Date   ABDOMINAL HYSTERECTOMY     CHOLECYSTECTOMY     ERCP N/A 11/06/2020   Procedure: ENDOSCOPIC RETROGRADE CHOLANGIOPANCREATOGRAPHY (ERCP);  Surgeon: Rosalie Kitchens, MD;  Location: [facility];  Service: Endoscopy;  Laterality: N/A;   REMOVAL OF STONES  11/06/2020   Procedure: REMOVAL OF STONES;  Surgeon: Rosalie Kitchens, MD;  Location: [facility];  Service: Endoscopy;;   SPHINCTEROTOMY  11/06/2020   Procedure: ANNETT;  Surgeon: Rosalie Kitchens, MD;  Location: [facility];  Service: Endoscopy;;    SOCIAL HISTORY: Social History   Socioeconomic History   Marital status: Widowed    Spouse name: Not on file   Number of children: Not on file   Years of education: Not on file   Highest education level: Not on file  Occupational History   Not on file  Tobacco Use   Smoking status: Never   Smokeless tobacco: Never  Vaping Use   Vaping status: Never Used  Substance and Sexual Activity   Alcohol use: Never   Drug use: Never   Sexual activity: Not on file  Other Topics Concern   Not on file  Social History Narrative   Not on file   Social Drivers of Health   Tobacco Use: Low Risk (05/10/2024)   Patient History    Smoking Tobacco Use: Never    Smokeless Tobacco Use: Never    Passive Exposure: Not on file  Financial Resource Strain: Not on file  Food Insecurity: Not on file  Transportation Needs: Not on file  Physical Activity: Not on file  Stress: Not on file  Social Connections: Not on file  Intimate Partner Violence: Not on file  Depression (EYV7-0): Not on file  Alcohol Screen: Not on file  Housing: Not on file  Utilities: Not on file  Health Literacy: Not on file    FAMILY HISTORY: Family History  Problem Relation Age of Onset   Diabetes Neg Hx     ALLERGIES:  has  no known allergies.  MEDICATIONS:  Current Outpatient Medications  Medication Sig Dispense Refill   acetaminophen  (TYLENOL ) 325 MG tablet Take 650 mg by mouth every 6 (six) hours as needed for mild pain, fever or headache.     amoxicillin -clavulanate (AUGMENTIN ) 875-125 MG tablet Take 1 tablet by mouth every 12 (twelve) hours. 14 tablet 0   dorzolamide -timolol  (COSOPT ) 22.3-6.8 MG/ML ophthalmic solution Place 1 drop into both eyes 2 (two) times daily.     latanoprost  (XALATAN ) 0.005 % ophthalmic solution Place 1 drop into both eyes at bedtime.     losartan  (COZAAR ) 25 MG tablet Take 25 mg by mouth daily.     terbinafine (LAMISIL) 250  MG tablet Take 250 mg by mouth daily. (Patient not taking: Reported on 11/14/2023)     trolamine salicylate (ASPERCREME) 10 % cream Apply 1 application topically as needed for muscle pain.     XARELTO  10 MG TABS tablet TAKE 1 TABLET(10 MG) BY MOUTH DAILY 30 tablet 0   No current facility-administered medications for this visit.    REVIEW OF SYSTEMS:   Constitutional: ( - ) fevers, ( - )  chills , ( - ) night sweats Eyes: ( - ) blurriness of vision, ( - ) double vision, ( - ) watery eyes Ears, nose, mouth, throat, and face: ( - ) mucositis, ( - ) sore throat Respiratory: ( - ) cough, ( - ) dyspnea, ( - ) wheezes Cardiovascular: ( - ) palpitation, ( - ) chest discomfort, ( - ) lower extremity swelling Gastrointestinal:  ( - ) nausea, ( - ) heartburn, ( - ) change in bowel habits Skin: ( - ) abnormal skin rashes Lymphatics: ( - ) new lymphadenopathy, ( - ) easy bruising Neurological: ( - ) numbness, ( - ) tingling, ( - ) new weaknesses Behavioral/Psych: ( - ) mood change, ( - ) new changes  All other systems were reviewed with the patient and are negative.  PHYSICAL EXAMINATION:  Vitals:   05/14/24 1449  BP: 100/64  Pulse: (!) 53  Resp: 13  Temp: (!) 97.2 F (36.2 C)  SpO2: 100%   Filed Weights   05/14/24 1449  Weight: 175 lb 11.2 oz (79.7 kg)    GENERAL: Elderly African-American female, alert, no distress and comfortable SKIN: skin color, texture, turgor are normal, no rashes or significant lesions EYES: conjunctiva are pink and non-injected, sclera clear LUNGS: clear to auscultation and percussion with normal breathing effort HEART: regular rate & rhythm and no murmurs and no lower extremity edema Musculoskeletal: no cyanosis of digits and no clubbing  PSYCH: alert & oriented x 3, fluent speech NEURO: no focal motor/sensory deficits  LABORATORY DATA:  I have reviewed the data as listed    Latest Ref Rng & Units 05/14/2024    2:07 PM 01/30/2024   10:47 AM 11/14/2023     3:39 PM  CBC  WBC 4.0 - 10.5 K/uL 4.6  7.7  5.1   Hemoglobin 12.0 - 15.0 g/dL 86.3  86.4  86.6   Hematocrit 36.0 - 46.0 % 40.0  41.0  39.5   Platelets 150 - 400 K/uL 149  173  146        Latest Ref Rng & Units 05/14/2024    2:07 PM 01/30/2024   10:47 AM 11/14/2023    3:39 PM  CMP  Glucose 70 - 99 mg/dL 874  875  91   BUN 8 - 23 mg/dL 17  9  18  Creatinine 0.44 - 1.00 mg/dL 9.21  9.29  9.22   Sodium 135 - 145 mmol/L 141  138  143   Potassium 3.5 - 5.1 mmol/L 4.1  4.1  4.7   Chloride 98 - 111 mmol/L 106  105  111   CO2 22 - 32 mmol/L 26  23  26    Calcium 8.9 - 10.3 mg/dL 9.7  9.0  9.6   Total Protein 6.5 - 8.1 g/dL 7.1  6.8  7.1   Total Bilirubin 0.0 - 1.2 mg/dL 0.8  0.7  0.5   Alkaline Phos 38 - 126 U/L 81  52  59   AST 15 - 41 U/L 17  17  18    ALT 0 - 44 U/L 6  10  6     RADIOGRAPHIC STUDIES: No results found.  ASSESSMENT & PLAN Paula Bentley 83 y.o. female with medical history significant for glaucoma who presents for evaluation of a history of pulmonary embolism, currently on chronic anticoagulation.    After review of the labs, review of the records, and discussion with the patient the patients findings are most consistent with a pulmonary embolism of unclear etiology.  Patient does not remember the circumstances around the time she developed a blood clot and there are no records available from the time of the blood clot.  Therefore we would need to assume that this is an unprovoked VTE while we are trying to find the root cause in outside records.   A provoked venous thromboembolism (VTE) is one that has a clear inciting factor or event. Provoking factors include prolonged travel/immobility, surgery (particular abdominal or orthropedic), trauma,  and pregnancy/ estrogen containing birth control. After a detailed history and review of the records there is no clear provoking factor for this patients VTE.  Patients with unprovoked VTEs have up to 25% recurrence after 5  years and 36% at 10 years, with 4% of these clots being fatal (BMJ?2019;366:l4363). Therefore the formal recommendation for unprovoked VTEs is lifelong anticoagulation, as the cause may not be transient or reversible. We recommend 6 months or full strength anticoagulation with a re-evaluation after that time.  The patients will then have a choice of maintenance dose DOAC (preferred, recommended), 81mg  ASA PO daily (non-preferred), or no further anticoagulation (not recommended).     #Pulmonary Embolism, Unknown/Unclear Etiology  # Reported Septic Emboli  --findings at this time are consistent with a provoked VTE, as a septic emboli is of infectious etiology and would therefore considered provoked.  Would need outside records in order to verify. --Unable to find records via Care Everywhere.  Patient thinks that she was seen at [facility] .  We will work on tracking down these records. --labs today show WBC 4.6, Hgb 13.6, MCV 91.1, Plt 149.  Creatinine LFTs within normal limits --continue Xarelto  to 10 mg p.o. daily as a maintenance dose. --patient denies any bleeding, bruising, or dark stools on this medication. It is well tolerated. No difficulties accessing/affording the medication  --RTC placeholder in 6 months.     No orders of the defined types were placed in this encounter.   All questions were answered. The patient knows to call the clinic with any problems, questions or concerns.  A total of more than 30 minutes were spent on this encounter with face-to-face time and non-face-to-face time, including preparing to see the patient, ordering tests and/or medications, counseling the patient and coordination of care as outlined above.   Norleen IVAR Kidney, MD  Department of Hematology/Oncology [facility] at [facility] Phone: [phone] Pager: [phone] Email: [email or web address]  05/14/2024 3:58 PM

## 2024-05-18 ENCOUNTER — Other Ambulatory Visit: Payer: Self-pay | Admitting: Hematology and Oncology

## 2024-06-18 ENCOUNTER — Other Ambulatory Visit: Payer: Self-pay | Admitting: Hematology and Oncology

## 2024-08-08 ENCOUNTER — Ambulatory Visit: Admitting: Podiatry

## 2024-11-15 ENCOUNTER — Inpatient Hospital Stay: Admitting: Hematology and Oncology

## 2024-11-15 ENCOUNTER — Inpatient Hospital Stay
